# Patient Record
Sex: Male | Born: 1975 | Race: White | Hispanic: No | Marital: Married | Smoking: Former smoker
Health system: Southern US, Community
[De-identification: ages and names within clinical notes are randomized; demographics above are authoritative.]

## PROBLEM LIST (undated history)

## (undated) DIAGNOSIS — K209 Esophagitis, unspecified without bleeding: Secondary | ICD-10-CM

## (undated) DIAGNOSIS — F419 Anxiety disorder, unspecified: Secondary | ICD-10-CM

## (undated) DIAGNOSIS — I10 Essential (primary) hypertension: Secondary | ICD-10-CM

## (undated) DIAGNOSIS — K449 Diaphragmatic hernia without obstruction or gangrene: Secondary | ICD-10-CM

## (undated) DIAGNOSIS — E785 Hyperlipidemia, unspecified: Secondary | ICD-10-CM

## (undated) DIAGNOSIS — K429 Umbilical hernia without obstruction or gangrene: Secondary | ICD-10-CM

## (undated) DIAGNOSIS — R7303 Prediabetes: Secondary | ICD-10-CM

## (undated) DIAGNOSIS — K2270 Barrett's esophagus without dysplasia: Secondary | ICD-10-CM

## (undated) DIAGNOSIS — K5792 Diverticulitis of intestine, part unspecified, without perforation or abscess without bleeding: Secondary | ICD-10-CM

## (undated) DIAGNOSIS — K219 Gastro-esophageal reflux disease without esophagitis: Secondary | ICD-10-CM

## (undated) DIAGNOSIS — K259 Gastric ulcer, unspecified as acute or chronic, without hemorrhage or perforation: Secondary | ICD-10-CM

## (undated) DIAGNOSIS — K279 Peptic ulcer, site unspecified, unspecified as acute or chronic, without hemorrhage or perforation: Secondary | ICD-10-CM

## (undated) HISTORY — DX: Anxiety disorder, unspecified: F41.9

## (undated) HISTORY — DX: Essential (primary) hypertension: I10

## (undated) HISTORY — DX: Barrett's esophagus without dysplasia: K22.70

## (undated) HISTORY — DX: Prediabetes: R73.03

## (undated) HISTORY — DX: Diaphragmatic hernia without obstruction or gangrene: K44.9

## (undated) HISTORY — DX: Umbilical hernia without obstruction or gangrene: K42.9

## (undated) HISTORY — DX: Gastro-esophageal reflux disease without esophagitis: K21.9

## (undated) HISTORY — DX: Hyperlipidemia, unspecified: E78.5

## (undated) HISTORY — DX: Esophagitis, unspecified: K20.9

## (undated) HISTORY — PX: UPPER GASTROINTESTINAL ENDOSCOPY: SHX188

## (undated) HISTORY — DX: Esophagitis, unspecified without bleeding: K20.90

---

## 2008-05-17 ENCOUNTER — Emergency Department (HOSPITAL_COMMUNITY): Admission: EM | Admit: 2008-05-17 | Discharge: 2008-05-18 | Payer: Self-pay | Admitting: Emergency Medicine

## 2008-05-18 ENCOUNTER — Emergency Department (HOSPITAL_COMMUNITY): Admission: EM | Admit: 2008-05-18 | Discharge: 2008-05-19 | Payer: Self-pay | Admitting: Emergency Medicine

## 2010-06-11 LAB — COMPREHENSIVE METABOLIC PANEL
ALT: 30 U/L (ref 0–53)
AST: 31 U/L (ref 0–37)
Alkaline Phosphatase: 56 U/L (ref 39–117)
BUN: 14 mg/dL (ref 6–23)
CO2: 23 mEq/L (ref 19–32)
Calcium: 9.2 mg/dL (ref 8.4–10.5)
Creatinine, Ser: 1.27 mg/dL (ref 0.4–1.5)
GFR calc Af Amer: >60 mL/min (ref >60)
GFR calc non Af Amer: >60 mL/min (ref >60)
Glucose, Bld: 106 mg/dL — ABNORMAL HIGH (ref 70–99)
Glucose, Bld: 156 mg/dL — ABNORMAL HIGH (ref 70–99)
Potassium: 2.7 mEq/L — CL (ref 3.5–5.1)
Sodium: 138 mEq/L (ref 135–145)
Total Protein: 7.2 g/dL (ref 6.0–8.3)
Total Protein: 7.4 g/dL (ref 6.0–8.3)

## 2010-06-11 LAB — CBC
HCT: 47.5 % (ref 39.0–52.0)
HCT: 49.1 % (ref 39.0–52.0)
Hemoglobin: 15.7 g/dL (ref 13.0–17.0)
Hemoglobin: 16.5 g/dL (ref 13.0–17.0)
MCHC: 33 g/dL (ref 30.0–36.0)
MCHC: 33.6 g/dL (ref 30.0–36.0)
MCV: 83 fL (ref 78.0–100.0)
MCV: 84.4 fL (ref 78.0–100.0)
Platelets: 256 K/uL (ref 150–400)
RBC: 5.63 MIL/uL (ref 4.22–5.81)
RBC: 5.92 MIL/uL — ABNORMAL HIGH (ref 4.22–5.81)
RDW: 12.8 % (ref 11.5–15.5)
RDW: 13.2 % (ref 11.5–15.5)
WBC: 12 K/uL — ABNORMAL HIGH (ref 4.0–10.5)

## 2010-06-11 LAB — GASTRIC OCCULT BLOOD (1-CARD TO LAB)
Occult Blood, Gastric: POSITIVE — AB
pH, Gastric: 4

## 2010-06-11 LAB — COMPREHENSIVE METABOLIC PANEL WITH GFR
Albumin: 4.4 g/dL (ref 3.5–5.2)
BUN: 23 mg/dL (ref 6–23)
CO2: 30 meq/L (ref 19–32)
Calcium: 9.5 mg/dL (ref 8.4–10.5)
Chloride: 96 meq/L (ref 96–112)
Creatinine, Ser: 1.33 mg/dL (ref 0.4–1.5)
GFR calc non Af Amer: >60 mL/min (ref >60)
Total Bilirubin: 1.7 mg/dL — ABNORMAL HIGH (ref 0.3–1.2)

## 2010-06-11 LAB — URINALYSIS, ROUTINE W REFLEX MICROSCOPIC
Glucose, UA: NEGATIVE mg/dL
Ketones, ur: 40 mg/dL — AB
Nitrite: NEGATIVE
Protein, ur: NEGATIVE mg/dL
pH: 7.5 (ref 5.0–8.0)

## 2010-06-11 LAB — DIFFERENTIAL
Basophils Absolute: 0 K/uL (ref 0.0–0.1)
Basophils Relative: 0 % (ref 0–1)
Basophils Relative: 0 % (ref 0–1)
Eosinophils Absolute: 0 10*3/uL (ref 0.0–0.7)
Eosinophils Absolute: 0 10*3/uL (ref 0.0–0.7)
Eosinophils Relative: 0 % (ref 0–5)
Lymphocytes Relative: 8 % — ABNORMAL LOW (ref 12–46)
Lymphs Abs: 1 K/uL (ref 0.7–4.0)
Lymphs Abs: 1.2 10*3/uL (ref 0.7–4.0)
Monocytes Absolute: 0.8 10*3/uL (ref 0.1–1.0)
Monocytes Relative: 7 % (ref 3–12)
Monocytes Relative: 7 % (ref 3–12)
Neutro Abs: 10.1 K/uL — ABNORMAL HIGH (ref 1.7–7.7)
Neutro Abs: 8.8 10*3/uL — ABNORMAL HIGH (ref 1.7–7.7)
Neutrophils Relative %: 82 % — ABNORMAL HIGH (ref 43–77)
Neutrophils Relative %: 85 % — ABNORMAL HIGH (ref 43–77)

## 2010-06-11 LAB — LIPASE, BLOOD
Lipase: 15 U/L (ref 11–59)
Lipase: 15 U/L (ref 11–59)

## 2011-03-13 ENCOUNTER — Encounter (HOSPITAL_COMMUNITY): Payer: Self-pay | Admitting: *Deleted

## 2011-03-13 ENCOUNTER — Emergency Department (HOSPITAL_COMMUNITY)
Admission: EM | Admit: 2011-03-13 | Discharge: 2011-03-13 | Payer: BC Managed Care – PPO | Attending: Emergency Medicine | Admitting: Emergency Medicine

## 2011-03-13 ENCOUNTER — Emergency Department (HOSPITAL_COMMUNITY): Payer: BC Managed Care – PPO

## 2011-03-13 DIAGNOSIS — E860 Dehydration: Secondary | ICD-10-CM | POA: Insufficient documentation

## 2011-03-13 DIAGNOSIS — D7289 Other specified disorders of white blood cells: Secondary | ICD-10-CM | POA: Insufficient documentation

## 2011-03-13 DIAGNOSIS — R112 Nausea with vomiting, unspecified: Secondary | ICD-10-CM | POA: Insufficient documentation

## 2011-03-13 DIAGNOSIS — D729 Disorder of white blood cells, unspecified: Secondary | ICD-10-CM

## 2011-03-13 DIAGNOSIS — R1033 Periumbilical pain: Secondary | ICD-10-CM | POA: Insufficient documentation

## 2011-03-13 HISTORY — DX: Gastric ulcer, unspecified as acute or chronic, without hemorrhage or perforation: K25.9

## 2011-03-13 LAB — DIFFERENTIAL
Basophils Absolute: 0 10*3/uL (ref 0.0–0.1)
Lymphocytes Relative: 5 % — ABNORMAL LOW (ref 12–46)
Monocytes Relative: 5 % (ref 3–12)
Neutrophils Relative %: 90 % — ABNORMAL HIGH (ref 43–77)

## 2011-03-13 LAB — URINALYSIS, ROUTINE W REFLEX MICROSCOPIC
Leukocytes, UA: NEGATIVE
Protein, ur: 30 mg/dL — AB
Urobilinogen, UA: 0.2 mg/dL (ref 0.0–1.0)

## 2011-03-13 LAB — COMPREHENSIVE METABOLIC PANEL
ALT: 20 U/L (ref 0–53)
AST: 24 U/L (ref 0–37)
Albumin: 4.6 g/dL (ref 3.5–5.2)
CO2: 22 mEq/L (ref 19–32)
Calcium: 9.9 mg/dL (ref 8.4–10.5)
Creatinine, Ser: 1.01 mg/dL (ref 0.50–1.35)
GFR calc Af Amer: >90 mL/min (ref >90)
GFR calc non Af Amer: >90 mL/min (ref >90)
Sodium: 137 mEq/L (ref 135–145)
Total Bilirubin: 0.6 mg/dL (ref 0.3–1.2)

## 2011-03-13 LAB — URINE MICROSCOPIC-ADD ON

## 2011-03-13 LAB — CBC
Platelets: 306 10*3/uL (ref 150–400)
RDW: 13.9 % (ref 11.5–15.5)
WBC: 26.1 10*3/uL — ABNORMAL HIGH (ref 4.0–10.5)

## 2011-03-13 MED ORDER — IOHEXOL 300 MG/ML  SOLN
40.0000 mL | Freq: Once | INTRAMUSCULAR | Status: AC | PRN
  Administered 2011-03-13: 40 mL via ORAL

## 2011-03-13 MED ORDER — PROMETHAZINE HCL 25 MG/ML IJ SOLN
25.0000 mg | Freq: Once | INTRAMUSCULAR | Status: AC
  Administered 2011-03-13: 25 mg via INTRAVENOUS
  Filled 2011-03-13: qty 1

## 2011-03-13 MED ORDER — FENTANYL CITRATE 0.05 MG/ML IJ SOLN
INTRAMUSCULAR | Status: AC
  Administered 2011-03-13: 50 ug via INTRAVENOUS
  Filled 2011-03-13: qty 2

## 2011-03-13 MED ORDER — SODIUM CHLORIDE 0.9 % IV SOLN
Freq: Once | INTRAVENOUS | Status: AC
  Administered 2011-03-13: 10000 mL via INTRAVENOUS

## 2011-03-13 MED ORDER — HYDROMORPHONE HCL PF 1 MG/ML IJ SOLN
1.0000 mg | Freq: Once | INTRAMUSCULAR | Status: AC
  Administered 2011-03-13: 1 mg via INTRAVENOUS
  Filled 2011-03-13: qty 1

## 2011-03-13 MED ORDER — ONDANSETRON HCL 4 MG/2ML IJ SOLN
4.0000 mg | Freq: Once | INTRAMUSCULAR | Status: AC
  Administered 2011-03-13: 4 mg via INTRAVENOUS

## 2011-03-13 MED ORDER — FAMOTIDINE 20 MG PO TABS: 20.0000 mg | ORAL_TABLET | Freq: Two times a day (BID) | ORAL | Status: DC

## 2011-03-13 MED ORDER — GI COCKTAIL ~~LOC~~
30.0000 mL | Freq: Once | ORAL | Status: AC
  Administered 2011-03-13: 30 mL via ORAL
  Filled 2011-03-13: qty 30

## 2011-03-13 MED ORDER — SODIUM CHLORIDE 0.9 % IV BOLUS (SEPSIS)
1000.0000 mL | Freq: Once | INTRAVENOUS | Status: AC
  Administered 2011-03-13: 1000 mL via INTRAVENOUS

## 2011-03-13 MED ORDER — ONDANSETRON HCL 4 MG/2ML IJ SOLN
INTRAMUSCULAR | Status: AC
  Administered 2011-03-13: 4 mg via INTRAVENOUS
  Filled 2011-03-13: qty 2

## 2011-03-13 MED ORDER — IOHEXOL 300 MG/ML  SOLN
100.0000 mL | Freq: Once | INTRAMUSCULAR | Status: AC | PRN
  Administered 2011-03-13: 100 mL via INTRAVENOUS

## 2011-03-13 MED ORDER — PROMETHAZINE HCL 25 MG PO TABS: 25.0000 mg | ORAL_TABLET | Freq: Four times a day (QID) | ORAL | Status: DC | PRN

## 2011-03-13 MED ORDER — OXYCODONE-ACETAMINOPHEN 5-325 MG PO TABS: 1.0000 | ORAL_TABLET | ORAL | Status: AC | PRN

## 2011-03-13 MED ORDER — FENTANYL CITRATE 0.05 MG/ML IJ SOLN
50.0000 ug | Freq: Once | INTRAMUSCULAR | Status: AC
  Administered 2011-03-13: 50 ug via INTRAVENOUS

## 2011-03-13 NOTE — ED Provider Notes (Signed)
History     CSN: [id number]  Arrival date & time 03/13/11  4098   First MD Initiated Contact with Patient 03/13/11 0324      Chief Complaint  Patient presents with  . Abdominal Pain  . Emesis    (Consider location/radiation/quality/duration/timing/severity/associated sxs/prior treatment) HPI Comments: 36 year old male with a history of peptic ulcer disease, presents with approximately 12 hours of mid abdominal cramping pain associated with multiple episodes of nausea and vomiting. He describes coffee-ground type vomiting earlier in the day. He denies any change in his bowel habits including rectal bleeding, dark or melanotic stools. Symptoms are persistent, not relief with medications at home, severe at times, associated with a crampy abdominal pain in the mid to lower abdomen which comes on intermittently every 5-10 seconds. He denies prior abdominal surgery.  Patient is a 36 y.o. male presenting with abdominal pain and vomiting. The history is provided by the patient and the spouse.  Abdominal Pain The primary symptoms of the illness include abdominal pain and vomiting.  Emesis  Associated symptoms include abdominal pain.    Past Medical History  Diagnosis Date  . Stomach ulcer     History reviewed. No pertinent past surgical history.  Family History  Problem Relation Age of Onset  . Adopted: Yes    History  Substance Use Topics  . Smoking status: Current Some Day Smoker  . Smokeless tobacco: Not on file  . Alcohol Use: Yes     occaisional      Review of Systems  Gastrointestinal: Positive for vomiting and abdominal pain.  All other systems reviewed and are negative.    Allergies  Penicillins  Home Medications   Current Outpatient Rx  Name Route Sig Dispense Refill  . OMEPRAZOLE 20 MG PO CPDR Oral Take 20 mg by mouth daily.    Marland Kitchen FAMOTIDINE 20 MG PO TABS Oral Take 1 tablet (20 mg total) by mouth 2 (two) times daily. 30 tablet 0  . OXYCODONE-ACETAMINOPHEN  5-325 MG PO TABS Oral Take 1 tablet by mouth every 4 (four) hours as needed for pain. May take 2 tablets PO q 6 hours for severe pain - Do not take with Tylenol as this tablet already contains tylenol 15 tablet 0  . PROMETHAZINE HCL 25 MG PO TABS Oral Take 1 tablet (25 mg total) by mouth every 6 (six) hours as needed for nausea. 12 tablet 0    BP 120/71  Pulse 103  Temp(Src) 97.7 F (36.5 C) (Oral)  Resp 30  SpO2 99%  Physical Exam  Nursing note and vitals reviewed. Constitutional: He appears well-developed and well-nourished.       Uncomfortable appearing  HENT:  Head: Normocephalic and atraumatic.  Mouth/Throat: No oropharyngeal exudate.       Slightly dry mucous membranes  Eyes: Conjunctivae and EOM are normal. Pupils are equal, round, and reactive to light. Right eye exhibits no discharge. Left eye exhibits no discharge. No scleral icterus.  Neck: Normal range of motion. Neck supple. No JVD present. No thyromegaly present.  Cardiovascular: Normal rate, regular rhythm, normal heart sounds and intact distal pulses.  Exam reveals no gallop and no friction rub.   No murmur heard. Pulmonary/Chest: Effort normal and breath sounds normal. No respiratory distress. He has no wheezes. He has no rales.  Abdominal: Soft. Bowel sounds are normal. He exhibits no distension and no mass. There is no tenderness.       Increased bowel sounds, no tenderness on my exam including epigastrium,  right lower quadrant, left lower quadrant  Musculoskeletal: Normal range of motion. He exhibits no edema and no tenderness.  Lymphadenopathy:    He has no cervical adenopathy.  Neurological: He is alert. Coordination normal.  Skin: Skin is warm and dry. No rash noted. No erythema.  Psychiatric: He has a normal mood and affect. His behavior is normal.    ED Course  Procedures (including critical care time)  Labs Reviewed  COMPREHENSIVE METABOLIC PANEL - Abnormal; Notable for the following:    Glucose, Bld  161 (*)    All other components within normal limits  CBC - Abnormal; Notable for the following:    WBC 26.1 (*)    All other components within normal limits  DIFFERENTIAL - Abnormal; Notable for the following:    Neutrophils Relative 90 (*)    Lymphocytes Relative 5 (*)    Neutro Abs 23.5 (*)    Monocytes Absolute 1.3 (*)    All other components within normal limits  URINALYSIS, ROUTINE W REFLEX MICROSCOPIC - Abnormal; Notable for the following:    pH 8.5 (*)    Glucose, UA 250 (*)    Ketones, ur 15 (*)    Protein, ur 30 (*)    All other components within normal limits  LIPASE, BLOOD  URINE MICROSCOPIC-ADD ON  CBC   Ct Abdomen Pelvis W Contrast  03/13/2011  *RADIOLOGY REPORT*  Clinical Data: Nausea, vomiting and abdominal pain; leukocytosis.  CT ABDOMEN AND PELVIS WITH CONTRAST  Technique:  Multidetector CT imaging of the abdomen and pelvis was performed following the standard protocol during bolus administration of intravenous contrast.  Contrast: OMNIPAQUE IOHEXOL 300 MG/ML IV SOLN  Comparison: None.  Findings: Mild bibasilar atelectasis is noted.  The liver and spleen are unremarkable in appearance.  The gallbladder is within normal limits.  The pancreas and adrenal glands are unremarkable.  The kidneys are unremarkable in appearance.  There is no evidence of hydronephrosis.  No renal or ureteral stones are seen.  No perinephric stranding is appreciated.  No free fluid is identified.  The small bowel is unremarkable in appearance.  The stomach is within normal limits.  No acute vascular abnormalities are seen.  The appendix is normal in caliber and contains air, without evidence for appendicitis.  Scattered diverticulosis is noted along the ascending colon, without evidence for diverticulitis.  Two small anterior abdominal wall hernias are noted superior to the umbilicus, containing only fat, with minimal soft tissue stranding.  The bladder is relatively decompressed; bladder wall  thickening may reflect cystitis.  The prostate remains normal in size.  No inguinal lymphadenopathy is seen.  No acute osseous abnormalities are identified.  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  IMPRESSION:  1.  Diffuse bladder wall thickening may reflect cystitis. 2.  Two small anterior abdominal wall hernias noted superior to the umbilicus, containing only fat, with minimal soft tissue inflammation. 3.  Scattered diverticulosis along the ascending colon, without evidence for diverticulitis. 4.  Mild bibasilar atelectasis noted. 5.  Chronic bilateral pars defects at L5, with minimal grade 1 anterolisthesis of L5 on S1.  Original Report Authenticated By: Tonia Ghent, M.D.     1. Abdominal pain   2. Nausea and vomiting   3. Dehydration   4. Neutrophilic leukocytosis       MDM  Patient has nausea and vomiting primarily. He states that when he vomits he does have some mid abdominal  pain. There is absolutely no tenderness in his abdomen on my exam. He is very soft, no guarding and not peritoneal. Vital signs show slight tachypnea related to pain, there are no pulmonary findings abnormal on my exam. Otherwise vital signs look normal. Would evaluate for dehydration, pancreatitis, cholecystitis, peptic ulcer disease. Doubt perforation given soft abdomen. IV fluids, antiemetics, pain medicines as needed.    5:00 AM, patient reevaluated and has almost complete resolution of pain after intravenous hydromorphone. Laboratory evaluation shows leukocytosis of 26,000, leftward shift with 90% neutrophils.  Comparing to past laboratory results, patient had essentially normal white blood cell count 2 years ago when last checked. Proceed with CT scan to rule out significant findings.   6:35 AM I have discussed with patient his results including dehydration, leukocytosis which is been unexplained at this point, cooperative metabolic panel, lipase. I have  also explained his CT scan findings. I have encouraged him to followup in one week for a repeat blood count test. I have reexamined his abdomen several times during his stay and it continues to be nontender. After his initial dose of medications he had complete resolution of pain and nausea and vomiting. He has been given 2 L of IV fluids for his dehydration and is agreeable to discharge with followup.  Discharge prescriptions:  #1 Pepcid #2 Percocet #3 Phenergan   Vida Roller, MD 03/13/11 [phone]

## 2011-03-13 NOTE — ED Notes (Signed)
MD at bedside. 

## 2011-03-13 NOTE — ED Notes (Addendum)
Sudden onset of abd pain, and vomiting, "feels dehydrated". (denies diarrea), "unsure of fever". mentions "stomach ulcer", last emesis PTA. Tried emitrol, dramamine & omeprazole.... Was unable to keep anything down. Pt alert, interactive, shivering. Mentions coffee ground and bloody emesis. H/o similar. Denies ETOH tonight. Violently shivering, rectal temp normal.

## 2011-04-11 ENCOUNTER — Encounter (HOSPITAL_COMMUNITY): Payer: Self-pay | Admitting: *Deleted

## 2011-04-11 ENCOUNTER — Emergency Department (HOSPITAL_COMMUNITY)
Admission: EM | Admit: 2011-04-11 | Discharge: 2011-04-11 | Disposition: A | Discharge status: Home / Self Care | Payer: BC Managed Care – PPO | Attending: Emergency Medicine | Admitting: Emergency Medicine

## 2011-04-11 DIAGNOSIS — Z79899 Other long term (current) drug therapy: Secondary | ICD-10-CM | POA: Insufficient documentation

## 2011-04-11 DIAGNOSIS — R61 Generalized hyperhidrosis: Secondary | ICD-10-CM | POA: Insufficient documentation

## 2011-04-11 DIAGNOSIS — R1031 Right lower quadrant pain: Secondary | ICD-10-CM | POA: Insufficient documentation

## 2011-04-11 DIAGNOSIS — R112 Nausea with vomiting, unspecified: Secondary | ICD-10-CM | POA: Insufficient documentation

## 2011-04-11 DIAGNOSIS — F17200 Nicotine dependence, unspecified, uncomplicated: Secondary | ICD-10-CM | POA: Insufficient documentation

## 2011-04-11 HISTORY — DX: Diverticulitis of intestine, part unspecified, without perforation or abscess without bleeding: K57.92

## 2011-04-11 LAB — URINALYSIS, ROUTINE W REFLEX MICROSCOPIC
Bilirubin Urine: NEGATIVE
Ketones, ur: 15 mg/dL — AB
Nitrite: NEGATIVE
Protein, ur: NEGATIVE mg/dL
pH: 8 (ref 5.0–8.0)

## 2011-04-11 LAB — COMPREHENSIVE METABOLIC PANEL
Alkaline Phosphatase: 62 U/L (ref 39–117)
BUN: 16 mg/dL (ref 6–23)
Creatinine, Ser: 1.14 mg/dL (ref 0.50–1.35)
GFR calc Af Amer: >90 mL/min (ref >90)
Glucose, Bld: 195 mg/dL — ABNORMAL HIGH (ref 70–99)
Potassium: 4 mEq/L (ref 3.5–5.1)
Total Bilirubin: 0.3 mg/dL (ref 0.3–1.2)
Total Protein: 8.1 g/dL (ref 6.0–8.3)

## 2011-04-11 LAB — CBC
MCH: 27.8 pg (ref 26.0–34.0)
MCHC: 34.8 g/dL (ref 30.0–36.0)
MCV: 80 fL (ref 78.0–100.0)
Platelets: 263 10*3/uL (ref 150–400)
RBC: 5.64 MIL/uL (ref 4.22–5.81)
RDW: 14 % (ref 11.5–15.5)

## 2011-04-11 MED ORDER — SODIUM CHLORIDE 0.9 % IV BOLUS (SEPSIS)
1000.0000 mL | Freq: Once | INTRAVENOUS | Status: AC
  Administered 2011-04-11: 1000 mL via INTRAVENOUS

## 2011-04-11 MED ORDER — DICYCLOMINE HCL 20 MG PO TABS: 20.0000 mg | ORAL_TABLET | Freq: Two times a day (BID) | ORAL | Status: DC

## 2011-04-11 MED ORDER — ONDANSETRON HCL 4 MG/2ML IJ SOLN
4.0000 mg | Freq: Once | INTRAMUSCULAR | Status: AC
  Administered 2011-04-11: 4 mg via INTRAVENOUS
  Filled 2011-04-11: qty 2

## 2011-04-11 MED ORDER — HYDROMORPHONE HCL PF 2 MG/ML IJ SOLN
2.0000 mg | Freq: Once | INTRAMUSCULAR | Status: AC
  Administered 2011-04-11: 2 mg via INTRAVENOUS
  Filled 2011-04-11: qty 1

## 2011-04-11 MED ORDER — PROMETHAZINE HCL 25 MG RE SUPP: 25.0000 mg | Freq: Four times a day (QID) | RECTAL | Status: DC | PRN

## 2011-04-11 MED ORDER — PROMETHAZINE HCL 25 MG/ML IJ SOLN
25.0000 mg | INTRAMUSCULAR | Status: AC
  Administered 2011-04-11: 25 mg via INTRAVENOUS
  Filled 2011-04-11: qty 1

## 2011-04-11 NOTE — ED Provider Notes (Signed)
Medical screening examination/treatment/procedure(s) were performed by non-physician practitioner and as supervising physician I was immediately available for consultation/collaboration.  Saulo Anthis R. Bellany Elbaum, MD 04/11/11 1513 

## 2011-04-11 NOTE — ED Provider Notes (Signed)
History     CSN: [id number]  Arrival date & time 04/11/11  1478   First MD Initiated Contact with Patient 04/11/11 [phone]      Chief Complaint  Patient presents with  . Emesis    (Consider location/radiation/quality/duration/timing/severity/associated sxs/prior treatment) HPI  Pt presents to the ED with complaitns of severe abdominal pain with vomiting that started last night at 11pm. He has had this problem before and was seen in the [facility] ED for the same on 03/13/2011. He had a scan done at that time which showed no acute findings to explain patients pain. Pt has seen GI in [place] and has had an endoscopy done with no abnormal findings a few years ago. Pt states that they have started him on anxiety medications because they are questioning if his pain is related. Patient not vomiting in ED but is dry heaving. He states that his vomit was watery and yellow. He describes the pain as right lower quadrant and does not radiate to any other location. Quality of the pain is cramping/twisting/sharp and 10/10.  Past Medical History  Diagnosis Date  . Stomach ulcer   . Diverticulitis     History reviewed. No pertinent past surgical history.  Family History  Problem Relation Age of Onset  . Adopted: Yes    History  Substance Use Topics  . Smoking status: Current Some Day Smoker  . Smokeless tobacco: Not on file  . Alcohol Use: Yes     occaisional      Review of Systems  All other systems reviewed and are negative.    Allergies  Penicillins  Home Medications   Current Outpatient Rx  Name Route Sig Dispense Refill  . FAMOTIDINE 20 MG PO TABS Oral Take 20 mg by mouth daily as needed. For acid reflux    . OMEPRAZOLE 20 MG PO CPDR Oral Take 20 mg by mouth daily.    . SUCRALFATE 1 G PO TABS Oral Take 1 g by mouth 3 (three) times daily.    Marland Kitchen DICYCLOMINE HCL 20 MG PO TABS Oral Take 1 tablet (20 mg total) by mouth 2 (two) times daily. 20 tablet 0  . PROMETHAZINE HCL 25 MG RE  SUPP Rectal Place 1 suppository (25 mg total) rectally every 6 (six) hours as needed for nausea. 12 each 0    BP 107/90  Pulse 86  Temp(Src) 97.7 F (36.5 C) (Oral)  Resp 18  SpO2 99%  Physical Exam  Nursing note and vitals reviewed. Constitutional: He is oriented to person, place, and time. He appears well-developed and well-nourished. Distressed: pt is very uncomfortable with pain.  HENT:  Head: Normocephalic and atraumatic.  Eyes: Pupils are equal, round, and reactive to light.  Neck: Normal range of motion.  Cardiovascular: Normal rate and regular rhythm.   Pulmonary/Chest: Effort normal and breath sounds normal.  Abdominal: Soft. He exhibits no distension. There is Tenderness: pt denies tenderness to palpation of all four quadrants. He states pain is inside of LLQ.Marland Kitchen There is no rebound and no guarding.  Musculoskeletal: Normal range of motion.  Neurological: He is oriented to person, place, and time.  Skin: Skin is warm. He is diaphoretic.    ED Course  Procedures (including critical care time)  Labs Reviewed  CBC - Abnormal; Notable for the following:    WBC 19.3 (*)    All other components within normal limits  URINALYSIS, ROUTINE W REFLEX MICROSCOPIC - Abnormal; Notable for the following:    Glucose, UA  500 (*)    Ketones, ur 15 (*)    All other components within normal limits  COMPREHENSIVE METABOLIC PANEL - Abnormal; Notable for the following:    Glucose, Bld 195 (*)    GFR calc non Af Amer 81 (*)    All other components within normal limits  LIPASE, BLOOD  BASIC METABOLIC PANEL   No results found.   1. Abdominal pain   2. Nausea and vomiting       MDM    Patient Complaints #1 nausea #2 vomiting #3 abdominal pain  Time of re-evaluation 7:43 AM- patient is still very nauseated but his abdominal pain is resolving.  8:57 AM- patient feeling much better now and is agreeable to go home. Pt was challeneged with oral fluids and was able to tolerate  without vomiting.  Plan  Pt has had multiple scans and his pain is now well controlled, we are considering a anxiety component to the abdominal pain. Dr. Rubin Payor and I believe he is safe to DC at this time.  Referrals Pt requests referral to a new GI doctor.  Prescriptions #1 Bentyl #2 Phenergan Supoository   Continue these medications as noted  [place], Kilmer  Home Medication Instructions [id number]   Printed on:04/11/11 0757  Medication Information                    famotidine (PEPCID) 20 MG tablet Take 20 mg by mouth daily as needed. For acid reflux           omeprazole (PRILOSEC) 20 MG capsule Take 20 mg by mouth daily.           sucralfate (CARAFATE) 1 G tablet Take 1 g by mouth 3 (three) times daily.             Pt has been given return to ER precautions. Pt is agreeable with plan and will return to the ED as needed for any changing or worsening of symptoms.         Dorthula Matas, PA 04/11/11 [phone]

## 2011-04-11 NOTE — ED Notes (Signed)
To ed for eval of vomiting since 11pm last pm. Pt states he was in ED last month for same with same symptoms. Dx with diverticulitis

## 2011-06-11 ENCOUNTER — Encounter: Payer: Self-pay | Admitting: Internal Medicine

## 2011-06-11 ENCOUNTER — Other Ambulatory Visit (INDEPENDENT_AMBULATORY_CARE_PROVIDER_SITE_OTHER): Payer: BC Managed Care – PPO

## 2011-06-11 ENCOUNTER — Ambulatory Visit (INDEPENDENT_AMBULATORY_CARE_PROVIDER_SITE_OTHER): Payer: BC Managed Care – PPO | Admitting: Internal Medicine

## 2011-06-11 VITALS — BP 130/82 | HR 78 | Temp 98.7°F | Resp 16 | Wt 194.0 lb

## 2011-06-11 DIAGNOSIS — R7309 Other abnormal glucose: Secondary | ICD-10-CM

## 2011-06-11 DIAGNOSIS — R10817 Generalized abdominal tenderness: Secondary | ICD-10-CM | POA: Insufficient documentation

## 2011-06-11 DIAGNOSIS — K279 Peptic ulcer, site unspecified, unspecified as acute or chronic, without hemorrhage or perforation: Secondary | ICD-10-CM

## 2011-06-11 LAB — CBC WITH DIFFERENTIAL/PLATELET
Eosinophils Relative: 1.7 % (ref 0.0–5.0)
HCT: 47.5 % (ref 39.0–52.0)
Monocytes Relative: 6.7 % (ref 3.0–12.0)
Neutrophils Relative %: 72.5 % (ref 43.0–77.0)
Platelets: 252 10*3/uL (ref 150.0–400.0)
WBC: 7.6 10*3/uL (ref 4.5–10.5)

## 2011-06-11 LAB — LDL CHOLESTEROL, DIRECT: Direct LDL: 158.3 mg/dL

## 2011-06-11 LAB — COMPREHENSIVE METABOLIC PANEL
Albumin: 4.6 g/dL (ref 3.5–5.2)
CO2: 30 mEq/L (ref 19–32)
GFR: 70.59 mL/min (ref >60.00)
Glucose, Bld: 100 mg/dL — ABNORMAL HIGH (ref 70–99)
Potassium: 4.3 mEq/L (ref 3.5–5.1)
Sodium: 139 mEq/L (ref 135–145)
Total Protein: 7.8 g/dL (ref 6.0–8.3)

## 2011-06-11 LAB — URINALYSIS, ROUTINE W REFLEX MICROSCOPIC
Bilirubin Urine: NEGATIVE
Leukocytes, UA: NEGATIVE
Nitrite: NEGATIVE
Specific Gravity, Urine: 1.02 (ref 1.000–1.030)
pH: 7.5 (ref 5.0–8.0)

## 2011-06-11 LAB — LIPID PANEL: HDL: 49.5 mg/dL (ref >39.00)

## 2011-06-11 LAB — AMYLASE: Amylase: 84 U/L (ref 27–131)

## 2011-06-11 LAB — TSH: TSH: 3.22 u[IU]/mL (ref 0.35–5.50)

## 2011-06-11 LAB — HEMOGLOBIN A1C: Hgb A1c MFr Bld: 5.7 % (ref 4.6–6.5)

## 2011-06-11 MED ORDER — DEXLANSOPRAZOLE 60 MG PO CPDR: 60.0000 mg | DELAYED_RELEASE_CAPSULE | Freq: Every day | ORAL | Status: DC

## 2011-06-11 NOTE — Assessment & Plan Note (Signed)
I will change his PPI to dexilant and have asked him to see GI about a repeat EGD - in 2010 at [facility] he had a significant amount of inflammatory gastritis so I am concerned that he may have scar tissue or a recurrent ulcer

## 2011-06-11 NOTE — Assessment & Plan Note (Signed)
I will check his labs to look for secondary causes, I do not see anything today that is acute

## 2011-06-11 NOTE — Patient Instructions (Signed)

## 2011-06-11 NOTE — Assessment & Plan Note (Signed)
I will check his a1c today to se if he has DM II

## 2011-06-11 NOTE — Progress Notes (Signed)
Subjective:    Patient ID: Warren Thomas, male    DOB: Apr 24, 1975, 36 y.o.   MRN: [id number]  Abdominal Pain This is a recurrent problem. The current episode started more than 1 year ago. The onset quality is gradual. The problem occurs intermittently. The most recent episode lasted 2 months. The problem has been unchanged. The pain is located in the LLQ. The pain is at a severity of 1/10. The pain is mild. The quality of the pain is sharp. The abdominal pain does not radiate. Associated symptoms include constipation, nausea and vomiting. Pertinent negatives include no anorexia, arthralgias, belching, diarrhea, dysuria, fever, flatus, frequency, headaches, hematochezia, hematuria, melena, myalgias or weight loss. The pain is aggravated by nothing. He has tried H2 blockers and proton pump inhibitors for the symptoms. The treatment provided mild relief. Prior diagnostic workup includes upper endoscopy and CT scan (in 2010 at [facility]). His past medical history is significant for PUD.      Review of Systems  Constitutional: Negative for fever, chills, weight loss, diaphoresis, activity change, appetite change, fatigue and unexpected weight change.  HENT: Negative.   Eyes: Negative.   Respiratory: Negative for apnea, cough, choking, chest tightness, shortness of breath, wheezing and stridor.   Cardiovascular: Negative for chest pain, palpitations and leg swelling.  Gastrointestinal: Positive for nausea, vomiting, abdominal pain and constipation. Negative for diarrhea, blood in stool, melena, hematochezia, abdominal distention, anal bleeding, rectal pain, anorexia and flatus.  Genitourinary: Negative for dysuria, urgency, frequency, hematuria, flank pain, decreased urine volume, discharge, penile swelling, scrotal swelling, enuresis, difficulty urinating, genital sores, penile pain and testicular pain.  Musculoskeletal: Negative for myalgias, back pain, joint swelling, arthralgias and gait problem.   Skin: Negative for color change, pallor, rash and wound.  Neurological: Negative.  Negative for headaches.  Hematological: Negative for adenopathy. Does not bruise/bleed easily.  Psychiatric/Behavioral: Negative.        Objective:   Physical Exam  Vitals reviewed. Constitutional: He is oriented to person, place, and time. He appears well-developed and well-nourished. No distress.  HENT:  Head: Normocephalic and atraumatic.  Mouth/Throat: Oropharynx is clear and moist. No oropharyngeal exudate.  Eyes: Conjunctivae are normal. Right eye exhibits no discharge. Left eye exhibits no discharge. No scleral icterus.  Neck: Normal range of motion. Neck supple. No JVD present. No tracheal deviation present. No thyromegaly present.  Cardiovascular: Normal rate, regular rhythm, normal heart sounds and intact distal pulses.  Exam reveals no gallop and no friction rub.   No murmur heard. Pulmonary/Chest: Effort normal and breath sounds normal. No stridor. No respiratory distress. He has no wheezes. He has no rales. He exhibits no tenderness.  Abdominal: Soft. Bowel sounds are normal. He exhibits no distension and no mass. There is no tenderness. There is no rebound and no guarding. Hernia confirmed negative in the right inguinal area and confirmed negative in the left inguinal area.  Genitourinary: Rectum normal, prostate normal, testes normal and penis normal. Rectal exam shows no external hemorrhoid, no internal hemorrhoid, no fissure, no mass, no tenderness and anal tone normal. Guaiac negative stool. Prostate is not enlarged and not tender. Right testis shows no mass, no swelling and no tenderness. Right testis is descended. Left testis shows no mass, no swelling and no tenderness. Left testis is descended. Circumcised. No penile tenderness. No discharge found.  Musculoskeletal: Normal range of motion. He exhibits no edema and no tenderness.  Lymphadenopathy:    He has no cervical adenopathy.  Right: No inguinal adenopathy present.       Left: No inguinal adenopathy present.  Neurological: He is oriented to person, place, and time.  Skin: Skin is warm and dry. No rash noted. He is not diaphoretic. No erythema. No pallor.  Psychiatric: He has a normal mood and affect. His behavior is normal. Judgment and thought content normal.     Lab Results  Component Value Date   WBC 19.3* 04/11/2011   HGB 15.7 04/11/2011   HCT 45.1 04/11/2011   PLT 263 04/11/2011   GLUCOSE 195* 04/11/2011   ALT 21 04/11/2011   AST 24 04/11/2011   NA 140 04/11/2011   K 4.0 04/11/2011   CL 100 04/11/2011   CREATININE 1.14 04/11/2011   BUN 16 04/11/2011   CO2 25 04/11/2011      Assessment & Plan:

## 2011-06-16 ENCOUNTER — Encounter: Payer: Self-pay | Admitting: Internal Medicine

## 2011-06-17 ENCOUNTER — Encounter: Payer: Self-pay | Admitting: Internal Medicine

## 2011-06-17 ENCOUNTER — Ambulatory Visit (INDEPENDENT_AMBULATORY_CARE_PROVIDER_SITE_OTHER): Payer: BC Managed Care – PPO | Admitting: Internal Medicine

## 2011-06-17 VITALS — BP 120/88 | HR 98 | Ht 69.0 in | Wt 191.4 lb

## 2011-06-17 DIAGNOSIS — K219 Gastro-esophageal reflux disease without esophagitis: Secondary | ICD-10-CM

## 2011-06-17 DIAGNOSIS — R110 Nausea: Secondary | ICD-10-CM

## 2011-06-17 DIAGNOSIS — K279 Peptic ulcer, site unspecified, unspecified as acute or chronic, without hemorrhage or perforation: Secondary | ICD-10-CM

## 2011-06-17 DIAGNOSIS — R1013 Epigastric pain: Secondary | ICD-10-CM

## 2011-06-17 MED ORDER — ONDANSETRON 4 MG PO TBDP: 4.0000 mg | ORAL_TABLET | Freq: Three times a day (TID) | ORAL | Status: AC | PRN

## 2011-06-17 MED ORDER — DEXLANSOPRAZOLE 60 MG PO CPDR: 60.0000 mg | DELAYED_RELEASE_CAPSULE | Freq: Every day | ORAL | Status: DC

## 2011-06-17 NOTE — Patient Instructions (Signed)
We have sent the following medications to your pharmacy for you to pick up at your convenience: Zofran ODT; take as directed    You have been scheduled for an endoscopy with propofol. Please follow written instructions given to you at your visit today.

## 2011-06-17 NOTE — Progress Notes (Signed)
Subjective:    Patient ID: Warren Thomas, male    DOB: 05-13-1975, 36 y.o.   MRN: [id number]  HPI Warren Thomas is a 36 yo male with PMH of intermittent episodes of epigastric pain, nausea and vomiting who seen in consultation at the request of Dr. Yetta Barre for evaluation of the same. He is alone today. The patient reports ongoing, but intermittent issues with abdominal pain, nausea and vomiting since 2001. He reports never seeking care from a doctor until about 2010. At that time he was having epigastric pain with nausea and vomiting, and underwent an upper endoscopy on 05/29/2008 in [place], [place]. He brings a copy of this report today which revealed LA grade C. esophagitis and moderate gastritis. At that time he was given acid suppression therapy, but he is no longer on such medicine, until recently when he was started on Dexilant by his primary care doctor.  He reports acute onset of nausea, vomiting, and epigastric abdominal pain which occurs every 3-6 months. He reports these attacks are very severe and incapacitating. He reports he can only get relief by seeking care in the emergency department for such attacks. His last attack was in February 2013. He reports feeling well today with no GI symptoms or complaints. He reports the attacks start with epigastric pain followed by intense nausea and vomiting. He reports associated dehydration and inability to take down fluids or food. These attacks last 24-48 hours. They're associated with subjective fever and chills. He also reports flushing. He reports occasionally hallucinations during these attacks. He reports in the past he's had electrolyte disturbances and leukocytosis during these events. He also reports severe left lower quadrant abdominal cramping, which is associated with the previously described attacks. The left lower quadrant pain usually occurs within 24 hours at the start of his upper abdominal pain. It is often this pain along with  the inability to keep down fluids to take him to the hospital. He reports that the only thing he is down that makes his left lower quadrant pain better is standing in a hot shower and drinking ice cold water and allowing it to run out of his mouth.  He does report a history of heartburn, but this improved with omeprazole therapy. He was recently changed to Dexilant and continues to have no trouble with heartburn, dysphagia or odynophagia. He has no abdominal pain today. Appetite is good. No nausea or vomiting. No fevers or chills recently.  He denies joint pains, rash. No urinary symptoms. No mouth ulcers.  Review of Systems As per history of present illness, otherwise notable for anxiety and issues with sleep disturbance  Patient Active Problem List  Diagnoses  . Abdominal tenderness, generalized  . PUD (peptic ulcer disease)  . Other abnormal glucose   Current Outpatient Prescriptions  Medication Sig Dispense Refill  . dexlansoprazole (DEXILANT) 60 MG capsule Take 1 capsule (60 mg total) by mouth daily.  90 capsule  3  . dicyclomine (BENTYL) 20 MG tablet Take 1 tablet (20 mg total) by mouth 2 (two) times daily.  20 tablet  0  . promethazine (PHENERGAN) 12.5 MG suppository Place 12.5 mg rectally.      . sucralfate (CARAFATE) 1 G tablet Take 1 g by mouth 3 (three) times daily.      Marland Kitchen DISCONTD: dexlansoprazole (DEXILANT) 60 MG capsule Take 1 capsule (60 mg total) by mouth daily.  90 capsule  3  . DISCONTD: dexlansoprazole (DEXILANT) 60 MG capsule Take 1 capsule (60 mg total)  by mouth daily.  90 capsule  3  . ondansetron (ZOFRAN-ODT) 4 MG disintegrating tablet Take 1 tablet (4 mg total) by mouth every 8 (eight) hours as needed for nausea.  20 tablet  0  . promethazine (PHENERGAN) 25 MG suppository Place 1 suppository (25 mg total) rectally every 6 (six) hours as needed for nausea.  12 each  0  . promethazine (PHENERGAN) 25 MG tablet Take 1 tablet (25 mg total) by mouth every 6 (six) hours as  needed for nausea.  12 tablet  0   Allergies  Allergen Reactions  . Penicillins Swelling   Family History  Problem Relation Age of Onset  . Adopted: Yes   History  Substance Use Topics  . Smoking status: Former Smoker    Quit date: 06/17/2010  . Smokeless tobacco: Not on file  . Alcohol Use: 1.2 oz/week    2 Cans of beer per week     occasional      Objective:   Physical Exam BP 120/88  Pulse 98  Ht 5\' 9"  (1.753 m)  Wt 191 lb 6.4 oz (86.818 kg)  BMI 28.26 kg/m2 Constitutional: Well-developed and well-nourished. No distress. HEENT: Normocephalic and atraumatic. Oropharynx is clear and moist. No oropharyngeal exudate. Conjunctivae are normal. Pupils are equal round and reactive to light. No scleral icterus. Neck: Neck supple. Trachea midline. Cardiovascular: Normal rate, regular rhythm and intact distal pulses. No M/R/G Pulmonary/chest: Effort normal and breath sounds normal. No wheezing, rales or rhonchi. Abdominal: Soft, nontender, nondistended. Bowel sounds active throughout. There are no masses palpable. No hepatosplenomegaly. Extremities: no clubbing, cyanosis, or edema Lymphadenopathy: No cervical adenopathy noted. Neurological: Alert and oriented to person place and time. Skin: Skin is warm and dry. No rashes noted. Psychiatric: Normal mood and affect. Behavior is normal.  CBC    Component Value Date/Time   WBC 7.6 06/11/2011 1126   RBC 5.71 06/11/2011 1126   HGB 15.6 06/11/2011 1126   HCT 47.5 06/11/2011 1126   PLT 252.0 06/11/2011 1126   MCV 83.3 06/11/2011 1126   MCH 27.8 04/11/2011 0701   MCHC 32.7 06/11/2011 1126   RDW 13.6 06/11/2011 1126   LYMPHSABS 1.4 06/11/2011 1126   MONOABS 0.5 06/11/2011 1126   EOSABS 0.1 06/11/2011 1126   BASOSABS 0.0 06/11/2011 1126    CMP     Component Value Date/Time   NA 139 06/11/2011 1126   K 4.3 06/11/2011 1126   CL 102 06/11/2011 1126   CO2 30 06/11/2011 1126   GLUCOSE 100* 06/11/2011 1126   BUN 13 06/11/2011 1126   CREATININE  1.2 06/11/2011 1126   CALCIUM 9.4 06/11/2011 1126   PROT 7.8 06/11/2011 1126   ALBUMIN 4.6 06/11/2011 1126   AST 24 06/11/2011 1126   ALT 22 06/11/2011 1126   ALKPHOS 55 06/11/2011 1126   BILITOT 0.3 06/11/2011 1126   GFRNONAA 81* 04/11/2011 0701   GFRAA >90 04/11/2011 0701   Lipase     Component Value Date/Time   LIPASE 17.0 06/11/2011 1126   Imaging, 03/03/2011 CT ABDOMEN AND PELVIS WITH CONTRAST   Technique:  Multidetector CT imaging of the abdomen and pelvis was performed following the standard protocol during bolus administration of intravenous contrast.   Contrast: OMNIPAQUE IOHEXOL 300 MG/ML IV SOLN   Comparison: None.   Findings: Mild bibasilar atelectasis is noted.   The liver and spleen are unremarkable in appearance.  The gallbladder is within normal limits.  The pancreas and adrenal glands are unremarkable.  The kidneys are unremarkable in appearance.  There is no evidence of hydronephrosis.  No renal or ureteral stones are seen.  No perinephric stranding is appreciated.   No free fluid is identified.  The small bowel is unremarkable in appearance.  The stomach is within normal limits.  No acute vascular abnormalities are seen.   The appendix is normal in caliber and contains air, without evidence for appendicitis.  Scattered diverticulosis is noted along the ascending colon, without evidence for diverticulitis.   Two small anterior abdominal wall hernias are noted superior to the umbilicus, containing only fat, with minimal soft tissue stranding.   The bladder is relatively decompressed; bladder wall thickening may reflect cystitis.  The prostate remains normal in size.  No inguinal lymphadenopathy is seen.   No acute osseous abnormalities are identified.  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   IMPRESSION:   1.  Diffuse bladder wall thickening may reflect cystitis. 2.  Two small anterior  abdominal wall hernias noted superior to the umbilicus, containing only fat, with minimal soft tissue inflammation. 3.  Scattered diverticulosis along the ascending colon, without evidence for diverticulitis. 4.  Mild bibasilar atelectasis noted. 5.  Chronic bilateral pars defects at L5, with minimal grade 1 anterolisthesis of L5 on S1.     Assessment & Plan:  Warren Thomas is a 36 yo male with PMH of intermittent episodes of epigastric pain, nausea and vomiting who seen in consultation at the request of Dr. Yetta Barre for evaluation of the same.  1. GERD/hx of esophagitis/gastritis -- the patient does have a history of gastritis, along with severe erosive esophagitis, as documented by EGD in 2010. Given this prior abnormality and the episodic attacks that he has described, I think repeating the upper endoscopy is reasonable. We discussed this test today, including the risks and benefits and he is agreeable to proceed. I would like him to continue with Dexilant 60 mg daily. He is advised to take this 30 minutes to one hour before his first meal of the day. See #2  2. Episodic epigastric pain/nausea/vomiting -- the etiology of the patient's episodic severe epigastric pain with nausea and vomiting is unclear. I'm not certain that these episodes can be explained by acid peptic disease alone. In reviewing his records and imaging studies, his CT abdomen was unrevealing as to the cause of these painful attacks. It does appear that he has right-sided diverticulosis, but he did not have evidence for diverticulitis. The CT also raised the possibility of cystitis, and he denies urinary symptoms now or during the attacks, making this an unlikely explanation.  He was noted to have a profound leukocytosis, 26,000 during an attack in January. He also has had electrolyte disturbances, in keeping with dehydration during these attacks. I will give him a prescription for Zofran ODT to be used early in these attacks, which  hopefully will help with some of the nausea and vomiting. I also, would like to repeat labs if and when he has a recurrent attack. I do not have an explanation at present, and he does not have any family history given his adoption to help guide [place]. Rare conditions such as familial Mediterranean fever or acute intermittent porphyria come to mind when thinking about episodic abdominal pain with nausea and vomiting. We will proceed with upper endoscopy, and consider further testing for more rare disorders which can cause episodic problems if and when the attacks recur.  He is happy with this plan

## 2011-06-21 ENCOUNTER — Ambulatory Visit (AMBULATORY_SURGERY_CENTER): Payer: BC Managed Care – PPO | Admitting: Internal Medicine

## 2011-06-21 ENCOUNTER — Encounter: Payer: Self-pay | Admitting: Internal Medicine

## 2011-06-21 VITALS — BP 127/90 | HR 93 | Temp 98.3°F | Resp 15 | Ht 69.0 in | Wt 191.0 lb

## 2011-06-21 DIAGNOSIS — R110 Nausea: Secondary | ICD-10-CM

## 2011-06-21 DIAGNOSIS — D1330 Benign neoplasm of unspecified part of small intestine: Secondary | ICD-10-CM

## 2011-06-21 DIAGNOSIS — K2990 Gastroduodenitis, unspecified, without bleeding: Secondary | ICD-10-CM

## 2011-06-21 DIAGNOSIS — R1013 Epigastric pain: Secondary | ICD-10-CM

## 2011-06-21 DIAGNOSIS — K279 Peptic ulcer, site unspecified, unspecified as acute or chronic, without hemorrhage or perforation: Secondary | ICD-10-CM

## 2011-06-21 DIAGNOSIS — K219 Gastro-esophageal reflux disease without esophagitis: Secondary | ICD-10-CM

## 2011-06-21 DIAGNOSIS — K2270 Barrett's esophagus without dysplasia: Secondary | ICD-10-CM

## 2011-06-21 DIAGNOSIS — K2970 Gastritis, unspecified, without bleeding: Secondary | ICD-10-CM

## 2011-06-21 MED ORDER — SODIUM CHLORIDE 0.9 % IV SOLN: 500.0000 mL | INTRAVENOUS | Status: DC

## 2011-06-21 NOTE — Patient Instructions (Signed)
PLEASE TAKE YOUR PPI , STOMACH MEDICATION, 20-30 MINUTES PRIOR TO BREAKFAST MEAL.  YOU HAD AN ENDOSCOPIC PROCEDURE TODAY AT THE [facility]: Refer to the procedure report that was given to you for any specific questions about what was found during the examination.  If the procedure report does not answer your questions, please call your gastroenterologist to clarify.  If you requested that your care partner not be given the details of your procedure findings, then the procedure report has been included in a sealed envelope for you to review at your convenience later.  YOU SHOULD EXPECT: Some feelings of bloating in the abdomen. Passage of more gas than usual.  Walking can help get rid of the air that was put into your GI tract during the procedure and reduce the bloating. If you had a lower endoscopy (such as a colonoscopy or flexible sigmoidoscopy) you may notice spotting of blood in your stool or on the toilet paper. If you underwent a bowel prep for your procedure, then you may not have a normal bowel movement for a few days.  DIET: Your first meal following the procedure should be a light meal and then it is ok to progress to your normal diet.  A half-sandwich or bowl of soup is an example of a good first meal.  Heavy or fried foods are harder to digest and may make you feel nauseous or bloated.  Likewise meals heavy in dairy and vegetables can cause extra gas to form and this can also increase the bloating.  Drink plenty of fluids but you should avoid alcoholic beverages for 24 hours.  ACTIVITY: Your care partner should take you home directly after the procedure.  You should plan to take it easy, moving slowly for the rest of the day.  You can resume normal activity the day after the procedure however you should NOT DRIVE or use heavy machinery for 24 hours (because of the sedation medicines used during the test).    SYMPTOMS TO REPORT IMMEDIATELY: A gastroenterologist can be reached at  any hour.  During normal business hours, 8:30 AM to 5:00 PM Monday through Friday, call [phone].  After hours and on weekends, please call the GI answering service at [phone] who will take a message and have the physician on call contact you.   Following lower endoscopy (colonoscopy or flexible sigmoidoscopy):  Excessive amounts of blood in the stool  Significant tenderness or worsening of abdominal pains  Swelling of the abdomen that is new, acute  Fever of 100F or higher  Following upper endoscopy (EGD)  Vomiting of blood or coffee ground material  New chest pain or pain under the shoulder blades  Painful or persistently difficult swallowing  New shortness of breath  Fever of 100F or higher  Black, tarry-looking stools  FOLLOW UP: If any biopsies were taken you will be contacted by phone or by letter within the next 1-3 weeks.  Call your gastroenterologist if you have not heard about the biopsies in 3 weeks.  Our staff will call the home number listed on your records the next business day following your procedure to check on you and address any questions or concerns that you may have at that time regarding the information given to you following your procedure. This is a courtesy call and so if there is no answer at the home number and we have not heard from you through the emergency physician on call, we will assume that you have  returned to your regular daily activities without incident.  SIGNATURES/CONFIDENTIALITY: You and/or your care partner have signed paperwork which will be entered into your electronic medical record.  These signatures attest to the fact that that the information above on your After Visit Summary has been reviewed and is understood.  Full responsibility of the confidentiality of this discharge information lies with you and/or your care-partner. YOU HAD AN ENDOSCOPIC PROCEDURE TODAY AT THE [facility]: Refer to the procedure report that was  given to you for any specific questions about what was found during the examination.  If the procedure report does not answer your questions, please call your gastroenterologist to clarify.  If you requested that your care partner not be given the details of your procedure findings, then the procedure report has been included in a sealed envelope for you to review at your convenience later.  YOU SHOULD EXPECT: Some feelings of bloating in the abdomen. Passage of more gas than usual.  Walking can help get rid of the air that was put into your GI tract during the procedure and reduce the bloating. If you had a lower endoscopy (such as a colonoscopy or flexible sigmoidoscopy) you may notice spotting of blood in your stool or on the toilet paper. If you underwent a bowel prep for your procedure, then you may not have a normal bowel movement for a few days.  DIET: Your first meal following the procedure should be a light meal and then it is ok to progress to your normal diet.  A half-sandwich or bowl of soup is an example of a good first meal.  Heavy or fried foods are harder to digest and may make you feel nauseous or bloated.  Likewise meals heavy in dairy and vegetables can cause extra gas to form and this can also increase the bloating.  Drink plenty of fluids but you should avoid alcoholic beverages for 24 hours.  ACTIVITY: Your care partner should take you home directly after the procedure.  You should plan to take it easy, moving slowly for the rest of the day.  You can resume normal activity the day after the procedure however you should NOT DRIVE or use heavy machinery for 24 hours (because of the sedation medicines used during the test).    SYMPTOMS TO REPORT IMMEDIATELY: A gastroenterologist can be reached at any hour.  During normal business hours, 8:30 AM to 5:00 PM Monday through Friday, call [phone].  After hours and on weekends, please call the GI answering service at [phone] who  will take a message and have the physician on call contact you.   Following lower endoscopy (colonoscopy or flexible sigmoidoscopy):  Excessive amounts of blood in the stool  Significant tenderness or worsening of abdominal pains  Swelling of the abdomen that is new, acute  Fever of 100F or higher  Following upper endoscopy (EGD)  Vomiting of blood or coffee ground material  New chest pain or pain under the shoulder blades  Painful or persistently difficult swallowing  New shortness of breath  Fever of 100F or higher  Black, tarry-looking stools  FOLLOW UP: If any biopsies were taken you will be contacted by phone or by letter within the next 1-3 weeks.  Call your gastroenterologist if you have not heard about the biopsies in 3 weeks.  Our staff will call the home number listed on your records the next business day following your procedure to check on you and address any questions  or concerns that you may have at that time regarding the information given to you following your procedure. This is a courtesy call and so if there is no answer at the home number and we have not heard from you through the emergency physician on call, we will assume that you have returned to your regular daily activities without incident.  SIGNATURES/CONFIDENTIALITY: You and/or your care partner have signed paperwork which will be entered into your electronic medical record.  These signatures attest to the fact that that the information above on your After Visit Summary has been reviewed and is understood.  Full responsibility of the confidentiality of this discharge information lies with you and/or your care-partner.

## 2011-06-21 NOTE — Progress Notes (Signed)
Patient did not experience any of the following events: a burn prior to discharge; a fall within the facility; wrong site/side/patient/procedure/implant event; or a hospital transfer or hospital admission upon discharge from the facility. (G8907) Patient did not have preoperative order for IV antibiotic SSI prophylaxis. (G8918)  

## 2011-06-21 NOTE — Progress Notes (Signed)
Marrion Coy, CRNA administered propofol.  Andrey Farmer, CRNA was in the procedure room to observe Marrion Coy, CRNA. Maw

## 2011-06-21 NOTE — Op Note (Signed)
[facility] [address] [place], [place]  [address]  ENDOSCOPY PROCEDURE REPORT  PATIENT:  Warren Thomas, Warren Thomas  MR#:  [id number] BIRTHDATE:  1975/04/01, 35 yrs. old  GENDER:  male ENDOSCOPIST:  Carie Caddy. Esmirna Ravan, MD Referred by:  Etta Grandchild, M.D. PROCEDURE DATE:  06/21/2011 PROCEDURE:  EGD with biopsy, 43239 ASA CLASS:  Class II INDICATIONS:  episodic epigastric pain with nausea and vomiting, history of esophagitis, history of gastritis MEDICATIONS:    MAC sedation, administered by CRNA, propofol (Diprivan) 400 mg IV TOPICAL ANESTHETIC:  none  DESCRIPTION OF PROCEDURE:   After the risks benefits and alternatives of the procedure were thoroughly explained, informed consent was obtained.  The [facility] [id number] endoscope was introduced through the mouth and advanced to the second portion of the duodenum, without limitations.  The instrument was slowly withdrawn as the mucosa was fully examined. <<PROCEDUREIMAGES>>  5 cm inlet patch in the proximal esophagus beginning at 20 cm from the incisors. With standard forceps, a biopsy was obtained and sent to pathology.  There were columnar-type mucosal changes in the distal esophagus, that could represent Barrett's esophagus in the distal esophagus. Multiple biopsies were obtained and sent to pathology.  Mild gastritis was found in the body and the antrum of the stomach. Biopsies of the antrum and body of the stomach were obtained and sent to pathology.  The duodenal bulb and second duodenum were without abnormalities. Random biopsies were obtained and sent to pathology.    Retroflexed views revealed no abnormalities.    The scope was then withdrawn from the patient and the procedure completed.  COMPLICATIONS:  None  ENDOSCOPIC IMPRESSION: 1) Inlet patch in the proximal esophagus.  Biopsy performed and sent to pathology. 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  Biopsies performed and sent to pathology. 3) Mild  gastritis in the body and the antrum of the stomach. Biopsies performed and sent to pathology. 4) Normal examined duodenum.  Random biopsies performed and sent to pathology.  RECOMMENDATIONS: 1) Await pathology results 2) Continue taking your PPI (antiacid medicine) once daily. It is best to be taken 20-30 minutes prior to breakfast meal.  Carie Caddy. Rhea Belton, MD  CC:  Etta Grandchild, MD The Patient  n. eSIGNEDCarie Caddy. Esraa Seres at 06/21/2011 10:26 AM  Blain Pais, [phone]

## 2011-06-22 ENCOUNTER — Telehealth: Payer: Self-pay

## 2011-06-22 NOTE — Telephone Encounter (Signed)
  Follow up Call-  Call back number 06/21/2011  Post procedure Call Back phone  # [phone]  Permission to leave phone message Yes     Patient questions:  Do you have a fever, pain , or abdominal swelling? no Pain Score  0 *  Have you tolerated food without any problems? yes  Have you been able to return to your normal activities? yes  Do you have any questions about your discharge instructions: Diet   no Medications  no Follow up visit  no  Do you have questions or concerns about your Care? no  Actions: * If pain score is 4 or above: No action needed, pain <4.  Per the pt "I am fine". Maw

## 2011-06-25 ENCOUNTER — Encounter: Payer: Self-pay | Admitting: Internal Medicine

## 2011-12-07 ENCOUNTER — Encounter: Payer: Self-pay | Admitting: Internal Medicine

## 2011-12-23 ENCOUNTER — Encounter: Payer: Self-pay | Admitting: Internal Medicine

## 2012-01-13 ENCOUNTER — Ambulatory Visit (AMBULATORY_SURGERY_CENTER): Payer: BC Managed Care – PPO

## 2012-01-13 ENCOUNTER — Encounter: Payer: Self-pay | Admitting: Internal Medicine

## 2012-01-13 VITALS — Ht 69.0 in | Wt 216.3 lb

## 2012-01-13 DIAGNOSIS — Z8719 Personal history of other diseases of the digestive system: Secondary | ICD-10-CM

## 2012-01-13 DIAGNOSIS — R109 Unspecified abdominal pain: Secondary | ICD-10-CM

## 2012-01-13 MED ORDER — MOVIPREP 100 G PO SOLR: ORAL | Status: DC

## 2012-02-10 ENCOUNTER — Encounter: Payer: BC Managed Care – PPO | Admitting: Internal Medicine

## 2012-02-10 ENCOUNTER — Encounter: Payer: Self-pay | Admitting: Internal Medicine

## 2012-02-10 ENCOUNTER — Ambulatory Visit (AMBULATORY_SURGERY_CENTER): Payer: BC Managed Care – PPO | Admitting: Internal Medicine

## 2012-02-10 VITALS — BP 146/89 | HR 88 | Temp 98.3°F | Resp 13 | Ht 69.0 in | Wt 216.0 lb

## 2012-02-10 DIAGNOSIS — K209 Esophagitis, unspecified: Secondary | ICD-10-CM

## 2012-02-10 DIAGNOSIS — K2270 Barrett's esophagus without dysplasia: Secondary | ICD-10-CM

## 2012-02-10 MED ORDER — SODIUM CHLORIDE 0.9 % IV SOLN: 500.0000 mL | INTRAVENOUS | Status: DC

## 2012-02-10 NOTE — Progress Notes (Signed)
Called to room to assist during endoscopic procedure.  Patient ID and intended procedure confirmed with present staff. Received instructions for my participation in the procedure from the performing physician. ewm 

## 2012-02-10 NOTE — Progress Notes (Signed)
Patient did not have preoperative order for IV antibiotic SSI prophylaxis. (G8918)  Patient did not experience any of the following events: a burn prior to discharge; a fall within the facility; wrong site/side/patient/procedure/implant event; or a hospital transfer or hospital admission upon discharge from the facility. (G8907)  

## 2012-02-10 NOTE — Patient Instructions (Addendum)
YOU HAD AN ENDOSCOPIC PROCEDURE TODAY AT THE [facility]: Refer to the procedure report that was given to you for any specific questions about what was found during the examination.  If the procedure report does not answer your questions, please call your gastroenterologist to clarify.  If you requested that your care partner not be given the details of your procedure findings, then the procedure report has been included in a sealed envelope for you to review at your convenience later.  YOU SHOULD EXPECT: Some feelings of bloating in the abdomen. Passage of more gas than usual.  Walking can help get rid of the air that was put into your GI tract during the procedure and reduce the bloating. If you had a lower endoscopy (such as a colonoscopy or flexible sigmoidoscopy) you may notice spotting of blood in your stool or on the toilet paper. If you underwent a bowel prep for your procedure, then you may not have a normal bowel movement for a few days.  DIET: Your first meal following the procedure should be a light meal and then it is ok to progress to your normal diet.  A half-sandwich or bowl of soup is an example of a good first meal.  Heavy or fried foods are harder to digest and may make you feel nauseous or bloated.  Likewise meals heavy in dairy and vegetables can cause extra gas to form and this can also increase the bloating.  Drink plenty of fluids but you should avoid alcoholic beverages for 24 hours.  ACTIVITY: Your care partner should take you home directly after the procedure.  You should plan to take it easy, moving slowly for the rest of the day.  You can resume normal activity the day after the procedure however you should NOT DRIVE or use heavy machinery for 24 hours (because of the sedation medicines used during the test).    SYMPTOMS TO REPORT IMMEDIATELY: A gastroenterologist can be reached at any hour.  During normal business hours, 8:30 AM to 5:00 PM Monday through Friday,  call [phone].  After hours and on weekends, please call the GI answering service at [phone] who will take a message and have the physician on call contact you.   Following upper endoscopy (EGD)  Vomiting of blood or coffee ground material  New chest pain or pain under the shoulder blades  Painful or persistently difficult swallowing  New shortness of breath  Fever of 100F or higher  Black, tarry-looking stools  FOLLOW UP: If any biopsies were taken you will be contacted by phone or by letter within the next 1-3 weeks.  Call your gastroenterologist if you have not heard about the biopsies in 3 weeks.  Our staff will call the home number listed on your records the next business day following your procedure to check on you and address any questions or concerns that you may have at that time regarding the information given to you following your procedure. This is a courtesy call and so if there is no answer at the home number and we have not heard from you through the emergency physician on call, we will assume that you have returned to your regular daily activities without incident.  Thank-you for choosing us for your healthcare needs.  SIGNATURES/CONFIDENTIALITY: You and/or your care partner have signed paperwork which will be entered into your electronic medical record.  These signatures attest to the fact that that the information above on your After Visit Summary   has been reviewed and is understood.  Full responsibility of the confidentiality of this discharge information lies with you and/or your care-partner.  

## 2012-02-11 ENCOUNTER — Telehealth: Payer: Self-pay | Admitting: *Deleted

## 2012-02-11 NOTE — Telephone Encounter (Signed)
No answer, left message to call if questions or concerns. 

## 2012-02-11 NOTE — Op Note (Signed)
 [facility] [address] [place], [address]   ENDOSCOPY PROCEDURE REPORT  PATIENT: Warren Thomas, Warren Thomas  MR#: [id number] BIRTHDATE: Oct 30, 1975 , 36  yrs. old GENDER: Male ENDOSCOPIST: Beverley Fiedler, MD PROCEDURE DATE:  02/10/2012 PROCEDURE:  EGD w/ biopsy ASA CLASS:     Class II INDICATIONS:  follow up of Barrett's esophagus. MEDICATIONS: MAC sedation, administered by CRNA, Propofol (Diprivan), and Propofol (Diprivan) 360 mg IV TOPICAL ANESTHETIC: Cetacaine Spray  DESCRIPTION OF PROCEDURE: After the risks benefits and alternatives of the procedure were thoroughly explained, informed consent was obtained.  The LB [id number]-H180 [id number] endoscope was introduced through the mouth and advanced to the second portion of the duodenum. Without limitations.  The instrument was slowly withdrawn as the mucosa was fully examined.     ESOPHAGUS: Inlet patch was again observed on opposing walls of the proximal esophagus at 17-20 cm from the incisors. Multiple biopsies were obtained  and placed in a separate jar.   There was evidence of Barrett's esophagus in the lower third of the esophagus extending from 35 cm to 37 cm from the incisors.  Prague criteria C1 M3.  Multiple biopsies were performed using cold forceps  and placed in 2 jars ( 35 cm and 37 cm from the incisors).   Sample sent for histology.  STOMACH: A 2 cm hiatus hernia was found.   The mucosa of the stomach appeared normal.  DUODENUM: The duodenal mucosa showed no abnormalities in the bulb and second portion of the duodenum. Retroflexed views revealed a hiatal hernia.     The scope was then withdrawn from the patient and the procedure completed.  COMPLICATIONS: There were no complications. ENDOSCOPIC IMPRESSION: 1.   Inlet patch 2.   There was evidence of Barrett's esophagus; multiple biopsies 3.   Hiatus hernia was found 4.   The mucosa of the stomach appeared normal 5.   The duodenal mucosa showed no  abnormalities in the bulb and second portion of the duodenum  RECOMMENDATIONS: 1.  Await pathology results 2.  Continue taking your PPI (antiacid medicine) once daily.  It is best to be taken 20-30 minutes prior to breakfast meal. 3.  Repeat EGD for Barrett's surveillance based on pathology results.   eSigned:  Beverley Fiedler, MD 02/10/2012 12:30 PM   [id number] Karsten Ro, MD and The Patient  PATIENT NAME:  [name], [name] MR#: [id number]

## 2012-02-14 ENCOUNTER — Telehealth: Payer: Self-pay | Admitting: Internal Medicine

## 2012-02-14 DIAGNOSIS — M549 Dorsalgia, unspecified: Secondary | ICD-10-CM

## 2012-02-14 DIAGNOSIS — R079 Chest pain, unspecified: Secondary | ICD-10-CM

## 2012-02-14 MED ORDER — HYOSCYAMINE SULFATE 0.125 MG SL SUBL: 0.1250 mg | SUBLINGUAL_TABLET | Freq: Four times a day (QID) | SUBLINGUAL | Status: DC | PRN

## 2012-02-14 NOTE — Telephone Encounter (Signed)
Informed pt of Dr Lauro Franklin recommendations; ordered Levsin and pt will come tomorrow for his Chest Xray.

## 2012-02-14 NOTE — Telephone Encounter (Signed)
Pt had EGD and a COLON prep on 02/10/12. He reports a dull pain on and off in his back between his shoulder blades.. He also has a pinching/cramping pain that comes and goes in his "colon" and a " small hair line" pain in his chest that goes down his l arm to his elbow. I asked if he is passing gas and he states he is belching and has flatus; he is having BMs. Pt states he should have called Friday and didn't, but his wife insisted he call today. Please advise. Thanks.

## 2012-02-14 NOTE — Telephone Encounter (Signed)
Likely related to procedure and I do not think it represents a serious complication. However, 2v CXR, is reasonable to confirm to abnormalities after EGD with biopsies. He should continue his PPI. The levsin may help his lower cramping pains

## 2012-02-15 ENCOUNTER — Ambulatory Visit (INDEPENDENT_AMBULATORY_CARE_PROVIDER_SITE_OTHER)
Admission: RE | Admit: 2012-02-15 | Discharge: 2012-02-15 | Disposition: A | Discharge status: Home / Self Care | Payer: BC Managed Care – PPO | Source: Ambulatory Visit | Attending: Internal Medicine | Admitting: Internal Medicine

## 2012-02-15 DIAGNOSIS — M549 Dorsalgia, unspecified: Secondary | ICD-10-CM

## 2012-02-15 DIAGNOSIS — R079 Chest pain, unspecified: Secondary | ICD-10-CM

## 2012-02-16 ENCOUNTER — Encounter: Payer: Self-pay | Admitting: Internal Medicine

## 2012-02-16 NOTE — Telephone Encounter (Signed)
Could give a trial of oral sucralfate 1 g 3 times a day a.c. and at bedtime, also could try simethicone for gas relief If this continues he should see our office or Dr. Yetta Barre

## 2012-02-16 NOTE — Telephone Encounter (Signed)
Pt left me a message asking me to leave results on his phone which I did. I also asked him to call back to inform us if his s&s were getting better. Pt left a message that he continues to have pain in L shoulder blade that moves to the R shoulder blade. Also, the pain in the center of his chest that moves to his L arm then radiates to his elbow is still there. Any advice? Thanks.

## 2012-02-17 MED ORDER — SUCRALFATE 1 GM/10ML PO SUSP: 1.0000 g | Freq: Three times a day (TID) | ORAL | Status: DC

## 2012-02-17 NOTE — Telephone Encounter (Signed)
Pt left a message requesting the carafate be ordered; he is still uncomfortable.

## 2012-02-17 NOTE — Telephone Encounter (Signed)
lmom for pt that I ordered the carafate liquid per Dr Rhea Belton; he had tabs ordered in 2/13. I also left a note for the pharmacist to give him OTC Simethicone.

## 2012-02-17 NOTE — Addendum Note (Signed)
Addended by: Florene Glen on: 02/17/2012 03:11 PM   Modules accepted: Orders

## 2012-02-17 NOTE — Telephone Encounter (Signed)
lmom for pt to call back

## 2012-06-25 ENCOUNTER — Emergency Department (HOSPITAL_COMMUNITY): Payer: BC Managed Care – PPO

## 2012-06-25 ENCOUNTER — Encounter (HOSPITAL_COMMUNITY): Payer: Self-pay | Admitting: Emergency Medicine

## 2012-06-25 ENCOUNTER — Emergency Department (HOSPITAL_COMMUNITY)
Admission: EM | Admit: 2012-06-25 | Discharge: 2012-06-25 | Disposition: A | Discharge status: Home / Self Care | Payer: BC Managed Care – PPO | Attending: Emergency Medicine | Admitting: Emergency Medicine

## 2012-06-25 DIAGNOSIS — Z8659 Personal history of other mental and behavioral disorders: Secondary | ICD-10-CM | POA: Insufficient documentation

## 2012-06-25 DIAGNOSIS — R197 Diarrhea, unspecified: Secondary | ICD-10-CM | POA: Insufficient documentation

## 2012-06-25 DIAGNOSIS — K219 Gastro-esophageal reflux disease without esophagitis: Secondary | ICD-10-CM | POA: Insufficient documentation

## 2012-06-25 DIAGNOSIS — R1031 Right lower quadrant pain: Secondary | ICD-10-CM | POA: Insufficient documentation

## 2012-06-25 DIAGNOSIS — Z8719 Personal history of other diseases of the digestive system: Secondary | ICD-10-CM | POA: Insufficient documentation

## 2012-06-25 DIAGNOSIS — R109 Unspecified abdominal pain: Secondary | ICD-10-CM

## 2012-06-25 DIAGNOSIS — Z79899 Other long term (current) drug therapy: Secondary | ICD-10-CM | POA: Insufficient documentation

## 2012-06-25 DIAGNOSIS — Z8711 Personal history of peptic ulcer disease: Secondary | ICD-10-CM | POA: Insufficient documentation

## 2012-06-25 DIAGNOSIS — R1011 Right upper quadrant pain: Secondary | ICD-10-CM | POA: Insufficient documentation

## 2012-06-25 DIAGNOSIS — Z87891 Personal history of nicotine dependence: Secondary | ICD-10-CM | POA: Insufficient documentation

## 2012-06-25 LAB — URINALYSIS, ROUTINE W REFLEX MICROSCOPIC
Glucose, UA: NEGATIVE mg/dL
Hgb urine dipstick: NEGATIVE
Ketones, ur: 15 mg/dL — AB
Protein, ur: 30 mg/dL — AB
Urobilinogen, UA: 0.2 mg/dL (ref 0.0–1.0)

## 2012-06-25 LAB — CBC WITH DIFFERENTIAL/PLATELET
Basophils Absolute: 0 10*3/uL (ref 0.0–0.1)
Eosinophils Absolute: 0.2 10*3/uL (ref 0.0–0.7)
Lymphs Abs: 1.1 10*3/uL (ref 0.7–4.0)
MCH: 27.1 pg (ref 26.0–34.0)
Neutrophils Relative %: 85 % — ABNORMAL HIGH (ref 43–77)
Platelets: 302 10*3/uL (ref 150–400)
RBC: 6.27 MIL/uL — ABNORMAL HIGH (ref 4.22–5.81)
RDW: 13 % (ref 11.5–15.5)
WBC: 14.3 10*3/uL — ABNORMAL HIGH (ref 4.0–10.5)

## 2012-06-25 LAB — URINE MICROSCOPIC-ADD ON

## 2012-06-25 LAB — COMPREHENSIVE METABOLIC PANEL
ALT: 17 U/L (ref 0–53)
AST: 16 U/L (ref 0–37)
Albumin: 4.3 g/dL (ref 3.5–5.2)
Alkaline Phosphatase: 62 U/L (ref 39–117)
Calcium: 9.4 mg/dL (ref 8.4–10.5)
Glucose, Bld: 119 mg/dL — ABNORMAL HIGH (ref 70–99)
Potassium: 3.9 mEq/L (ref 3.5–5.1)
Sodium: 140 mEq/L (ref 135–145)
Total Protein: 7.9 g/dL (ref 6.0–8.3)

## 2012-06-25 MED ORDER — SODIUM CHLORIDE 0.9 % IV SOLN
Freq: Once | INTRAVENOUS | Status: AC
  Administered 2012-06-25: 07:00:00 via INTRAVENOUS

## 2012-06-25 MED ORDER — IOHEXOL 300 MG/ML  SOLN
50.0000 mL | Freq: Once | INTRAMUSCULAR | Status: AC | PRN
  Administered 2012-06-25: 50 mL via ORAL

## 2012-06-25 MED ORDER — MORPHINE SULFATE 4 MG/ML IJ SOLN
4.0000 mg | Freq: Once | INTRAMUSCULAR | Status: AC
  Administered 2012-06-25: 4 mg via INTRAVENOUS
  Filled 2012-06-25: qty 1

## 2012-06-25 MED ORDER — HYDROCODONE-ACETAMINOPHEN 5-325 MG PO TABS: 1.0000 | ORAL_TABLET | ORAL | Status: DC | PRN

## 2012-06-25 MED ORDER — CHOLESTYRAMINE 4 G PO PACK: 1.0000 | PACK | Freq: Three times a day (TID) | ORAL | Status: DC

## 2012-06-25 MED ORDER — MORPHINE SULFATE 4 MG/ML IJ SOLN: 4.0000 mg | Freq: Once | INTRAMUSCULAR | Status: DC

## 2012-06-25 MED ORDER — IOHEXOL 300 MG/ML  SOLN
100.0000 mL | Freq: Once | INTRAMUSCULAR | Status: AC | PRN
  Administered 2012-06-25: 100 mL via INTRAVENOUS

## 2012-06-25 MED ORDER — ONDANSETRON HCL 4 MG/2ML IJ SOLN
4.0000 mg | Freq: Once | INTRAMUSCULAR | Status: AC
  Administered 2012-06-25: 4 mg via INTRAVENOUS
  Filled 2012-06-25: qty 2

## 2012-06-25 MED ORDER — ONDANSETRON HCL 4 MG PO TABS: 4.0000 mg | ORAL_TABLET | Freq: Three times a day (TID) | ORAL | Status: DC | PRN

## 2012-06-25 NOTE — ED Notes (Addendum)
Abd. Pain. Rt. Upper side, radiates to lower rt. Side. No hx of kidney stones. Diarrhea all day yesterday.

## 2012-06-25 NOTE — ED Provider Notes (Signed)
History     CSN: [id number]  Arrival date & time 06/25/12  0409   First MD Initiated Contact with Patient 06/25/12 [phone]      Chief Complaint  Patient presents with  . Abdominal Pain    (Consider location/radiation/quality/duration/timing/severity/associated sxs/prior treatment) HPI  37 year old male with a past medical history significant for Barrett's esophagus, previous peptic ulcer disease, diverticulitis presents to the emergency department with chief complaint of abdominal pain.  Patient states that he began having pain in his right upper quadrant yesterday that progressed into his right lower quadrant and is now constant in that area.  He has had light-colored watery diarrhea for the past week.  Yesterday he noticed coffee ground material in his stools. He denies contacts with similar sxs, ingestion of suspect foods or water, history of similar sxs, recent foreign travel .  Patient denies any blood or mucus in his stool.  He has had nausea without vomiting.  Patient has been using Tylenol, Emetrol and Tikosyn mean with out relief of his symptoms Denies fevers, chills, myalgias, arthralgias. Denies DOE, SOB, chest tightness or pressure, radiation to left arm, jaw or back, or diaphoresis. Denies dysuria, flank pain, suprapubic pain, frequency, urgency, or hematuria.    Past Medical History  Diagnosis Date  . Stomach ulcer   . Diverticulitis   . GERD (gastroesophageal reflux disease)   . Anxiety     Past Surgical History  Procedure Laterality Date  . Upper gastrointestinal endoscopy      Family History  Problem Relation Age of Onset  . Adopted: Yes    History  Substance Use Topics  . Smoking status: Former Smoker    Quit date: 06/17/2010  . Smokeless tobacco: Never Used  . Alcohol Use: 1.2 oz/week    2 Cans of beer per week     Comment: occasional      Review of Systems Ten systems reviewed and are negative for acute change, except as noted in the HPI.    Allergies  Penicillins  Home Medications   Current Outpatient Rx  Name  Route  Sig  Dispense  Refill  . acetaminophen (TYLENOL) 500 MG tablet   Oral   Take 1,000 mg by mouth every 6 (six) hours as needed for pain.         Marland Kitchen anti-nausea (EMETROL) solution   Oral   Take 30 mLs by mouth every 15 (fifteen) minutes as needed for nausea.         . hyoscyamine (LEVSIN SL) 0.125 MG SL tablet   Sublingual   Place 1 tablet (0.125 mg total) under the tongue every 6 (six) hours as needed for cramping.   30 tablet   0   . omeprazole (PRILOSEC) 40 MG capsule   Oral   Take 40 mg by mouth daily.           BP 136/92  Pulse 92  Temp(Src) 98.6 F (37 C) (Oral)  Resp 18  SpO2 98%  Physical Exam Physical Exam  Nursing note and vitals reviewed. Constitutional: He appears well-developed and well-nourished.  Very uncomfortable. HENT:  Head: Normocephalic and atraumatic.  Eyes: Conjunctivae normal are normal. No scleral icterus.  Neck: Normal range of motion. Neck supple.  Cardiovascular: Normal rate, regular rhythm and normal heart sounds.   Pulmonary/Chest: Effort normal and breath sounds normal. No respiratory distress.  Abdominal: Soft.  There is no rigidity or guarding.  Bowel sounds are normal.  The patient has umbilical hernia present.  He  is moderately tender to palpation in the right lower cautery.  Negative rebound, negative so as her obturator sign.  No peritoneal signs.   Musculoskeletal: He exhibits no edema.  Neurological: He is alert.  Skin: Skin is warm and dry. He is not diaphoretic.  Psychiatric: His behavior is normal.    ED Course  Procedures (including critical care time)  Labs Reviewed  URINALYSIS, ROUTINE W REFLEX MICROSCOPIC - Abnormal; Notable for the following:    Specific Gravity, Urine 1.035 (*)    Ketones, ur 15 (*)    Protein, ur 30 (*)    All other components within normal limits  URINE MICROSCOPIC-ADD ON - Abnormal; Notable for the  following:    Squamous Epithelial / LPF FEW (*)    Bacteria, UA FEW (*)    All other components within normal limits  CBC WITH DIFFERENTIAL - Abnormal; Notable for the following:    WBC 14.3 (*)    RBC 6.27 (*)    Neutrophils Relative 85 (*)    Neutro Abs 12.1 (*)    Lymphocytes Relative 8 (*)    All other components within normal limits  COMPREHENSIVE METABOLIC PANEL - Abnormal; Notable for the following:    Glucose, Bld 119 (*)    GFR calc non Af Amer 68 (*)    GFR calc Af Amer 79 (*)    All other components within normal limits  LIPASE, BLOOD   No results found.   1. Abdominal pain   2. Diarrhea       MDM  7:11 AM Filed Vitals:   06/25/12 0708  BP: 140/89  Pulse: 94  Temp:   Resp: 16   Shin with a complicated gastrointestinal history.  He is followed by Dr. Alger Memos. Patient has had multiple workups for his previous symptoms including to endoscopies.  Patient states that it never had this type of pain and has never been on his right side. I have ordered basic labs for evaluation and CT scan of the abdomen to rule out developing appendicitis.  He is afebrile and vital signs are stable.  I've offered him pain medications.  It appears to have an elevated blood pressure although I feel that this is an inaccurate reading and will reevaluate shortly.  9:00 AM Filed Vitals:   06/25/12 0411 06/25/12 0531 06/25/12 0708 06/25/12 0723  BP: 140/105 136/92 140/89 140/93  Pulse: 99 92 94   Temp: 98.2 F (36.8 C) 98.6 F (37 C)    TempSrc:  Oral    Resp: 24 18 16    SpO2: 99% 98% 98%    CT scan negative for acute abnormality. He continues to have pain although it is decreased. I will discharge the patient with cholestyramine, zofran and pain meds. I have suggested that the patient may need to get a stool culture if his diarrhea continues. He has had no episodes while here at the ED. I have also suggested that he use Culturelle.       Arthor Captain, PA-C 06/25/12  1012

## 2012-06-27 NOTE — ED Provider Notes (Signed)
Medical screening examination/treatment/procedure(s) were performed by non-physician practitioner and as supervising physician I was immediately available for consultation/collaboration.  Geoffery Lyons, MD 06/27/12 2042

## 2013-02-27 ENCOUNTER — Ambulatory Visit (INDEPENDENT_AMBULATORY_CARE_PROVIDER_SITE_OTHER): Payer: BC Managed Care – PPO | Admitting: Emergency Medicine

## 2013-02-27 ENCOUNTER — Encounter: Payer: Self-pay | Admitting: Emergency Medicine

## 2013-02-27 VITALS — BP 142/98 | HR 104 | Temp 101.6°F | Resp 18 | Ht 68.5 in | Wt 234.0 lb

## 2013-02-27 DIAGNOSIS — J09X2 Influenza due to identified novel influenza A virus with other respiratory manifestations: Secondary | ICD-10-CM

## 2013-02-27 DIAGNOSIS — R509 Fever, unspecified: Secondary | ICD-10-CM

## 2013-02-27 MED ORDER — BENZONATATE 100 MG PO CAPS: 100.0000 mg | ORAL_CAPSULE | Freq: Three times a day (TID) | ORAL | Status: DC | PRN

## 2013-02-27 MED ORDER — HYDROCODONE-ACETAMINOPHEN 5-325 MG PO TABS: 1.0000 | ORAL_TABLET | Freq: Four times a day (QID) | ORAL | Status: DC | PRN

## 2013-02-27 MED ORDER — AZITHROMYCIN 250 MG PO TABS: ORAL_TABLET | ORAL | Status: AC

## 2013-02-27 MED ORDER — PREDNISONE 10 MG PO TABS: ORAL_TABLET | ORAL | Status: DC

## 2013-02-27 NOTE — Patient Instructions (Signed)

## 2013-02-27 NOTE — Progress Notes (Signed)
   Subjective:    Patient ID: Warren Thomas, male    DOB: 10-13-1975, 37 y.o.   MRN: [id number]  HPI Comments: 37 yo male with Cough x 2 days but has noticed increased congestion. Fever of >101 yesterday + Fatigue/ Myalgia/ arthralgias/ nausea. Increased headache.  Cough Associated symptoms include a fever and myalgias.  Fever  Associated symptoms include coughing.    Current Outpatient Prescriptions on File Prior to Visit  Medication Sig Dispense Refill  . acetaminophen (TYLENOL) 500 MG tablet Take 1,000 mg by mouth every 6 (six) hours as needed for pain.       No current facility-administered medications on file prior to visit.   ALLERGIES Penicillins  Past Medical History  Diagnosis Date  . Stomach ulcer   . Diverticulitis   . GERD (gastroesophageal reflux disease)   . Anxiety      Review of Systems  Constitutional: Positive for fever.  Respiratory: Positive for cough.   Musculoskeletal: Positive for arthralgias and myalgias.  BP 142/98  Pulse 104  Temp(Src) 101.6 F (38.7 C) (Oral)  Resp 18  Ht 5' 8.5" (1.74 m)  Wt 234 lb (106.142 kg)  BMI 35.06 kg/m2      Objective:   Physical Exam  Nursing note and vitals reviewed. Constitutional: He is oriented to person, place, and time. He appears well-developed and well-nourished.  Appears ill.  HENT:  Head: Normocephalic and atraumatic.  Right Ear: External ear normal.  Left Ear: External ear normal.  Nose: Nose normal.  Mouth/Throat: No oropharyngeal exudate.  Eyes: Conjunctivae and EOM are normal.  Neck: Normal range of motion. Neck supple. No JVD present. No thyromegaly present.  Cardiovascular: Normal rate, regular rhythm, normal heart sounds and intact distal pulses.   Pulmonary/Chest: Effort normal and breath sounds normal.  Congested cough  Abdominal: Soft. Bowel sounds are normal. He exhibits no distension and no mass. There is no tenderness. There is no rebound and no guarding.  Musculoskeletal:  Normal range of motion. He exhibits no edema and no tenderness.  Lymphadenopathy:    He has no cervical adenopathy.  Neurological: He is alert and oriented to person, place, and time. He has normal reflexes. No cranial nerve deficit. Coordination normal.  Skin: Skin is warm and dry.  Psychiatric: He has a normal mood and affect. His behavior is normal. Judgment and thought content normal.      Flu a +    Assessment & Plan:  Influenza A- If sx increase ER. Push fluids, Tylenol q8 hours x 2days, Pred DP 10 mg/ Tessalon perles both AD, if any color to production Zpak AD. OOW x 5 days or fever free x 24 hours.

## 2013-04-02 DIAGNOSIS — F419 Anxiety disorder, unspecified: Secondary | ICD-10-CM | POA: Insufficient documentation

## 2013-04-02 DIAGNOSIS — K2270 Barrett's esophagus without dysplasia: Secondary | ICD-10-CM | POA: Insufficient documentation

## 2013-04-02 DIAGNOSIS — K5792 Diverticulitis of intestine, part unspecified, without perforation or abscess without bleeding: Secondary | ICD-10-CM | POA: Insufficient documentation

## 2013-04-02 DIAGNOSIS — K219 Gastro-esophageal reflux disease without esophagitis: Secondary | ICD-10-CM | POA: Insufficient documentation

## 2013-04-03 ENCOUNTER — Encounter: Payer: Self-pay | Admitting: Physician Assistant

## 2013-04-03 ENCOUNTER — Ambulatory Visit (INDEPENDENT_AMBULATORY_CARE_PROVIDER_SITE_OTHER): Payer: BC Managed Care – PPO | Admitting: Physician Assistant

## 2013-04-03 VITALS — BP 138/98 | HR 88 | Temp 98.6°F | Resp 16 | Ht 68.5 in | Wt 234.0 lb

## 2013-04-03 DIAGNOSIS — R7303 Prediabetes: Secondary | ICD-10-CM

## 2013-04-03 DIAGNOSIS — Z0000 Encounter for general adult medical examination without abnormal findings: Secondary | ICD-10-CM

## 2013-04-03 DIAGNOSIS — Z79899 Other long term (current) drug therapy: Secondary | ICD-10-CM

## 2013-04-03 DIAGNOSIS — E782 Mixed hyperlipidemia: Secondary | ICD-10-CM

## 2013-04-03 DIAGNOSIS — R7309 Other abnormal glucose: Secondary | ICD-10-CM

## 2013-04-03 DIAGNOSIS — E785 Hyperlipidemia, unspecified: Secondary | ICD-10-CM | POA: Insufficient documentation

## 2013-04-03 DIAGNOSIS — R5383 Other fatigue: Secondary | ICD-10-CM

## 2013-04-03 DIAGNOSIS — E559 Vitamin D deficiency, unspecified: Secondary | ICD-10-CM

## 2013-04-03 DIAGNOSIS — R5381 Other malaise: Secondary | ICD-10-CM

## 2013-04-03 DIAGNOSIS — I10 Essential (primary) hypertension: Secondary | ICD-10-CM

## 2013-04-03 LAB — HEMOGLOBIN A1C
Hgb A1c MFr Bld: 6 % — ABNORMAL HIGH (ref <5.7)
Mean Plasma Glucose: 126 mg/dL — ABNORMAL HIGH (ref <117)

## 2013-04-03 MED ORDER — ALPRAZOLAM 1 MG PO TABS
1.0000 mg | ORAL_TABLET | Freq: Every evening | ORAL | Status: DC | PRN
Start: 2013-04-03 — End: 2013-08-14

## 2013-04-03 MED ORDER — MELOXICAM 15 MG PO TABS: 15.0000 mg | ORAL_TABLET | Freq: Every day | ORAL | Status: DC

## 2013-04-03 NOTE — Progress Notes (Signed)
HPI Patient presents for 3 month follow up with hypertension, hyperlipidemia, prediabetes and vitamin D.  Patient's blood pressure has been controlled at home, today their BP is BP: 138/98 mmHg  Patient denies chest pain, shortness of breath, dizziness.   Patient's cholesterol is diet controlled.  His cholesterol is not at goal. The cholesterol last visit was:   Lab Results  Component Value Date   CHOL 231* 06/11/2011   HDL 49.50 06/11/2011   LDLDIRECT 158.3 06/11/2011   TRIG 114.0 06/11/2011   CHOLHDL 5 06/11/2011    he has not been working on diet and exercise for preiabetes, and denies blurry vision, polydipsia, polyphagia and polyuria. His A1C is not at goal. He states if he does not eat much or if he has not eaten in a long time he will get flushed, shakey, and have to eat. Not check sugar at this time.  Last A1C in the office was:  Lab Results  Component Value Date   HGBA1C 5.7 06/11/2011   Patient is on Vitamin D supplement.    Right knee pain, feels stiff in the morning and lasts for 4 hours. He has had some weight gain. More in the front of his knee, occasional loud pop without pain, no clicking, locking, instability. GERD controlled with Nexium.  Increasing anxiety, new job.   Current Medications:  Current Outpatient Prescriptions on File Prior to Visit  Medication Sig Dispense Refill  . OVER THE COUNTER MEDICATION 20 mg 2 (two) times daily. OTC Nexium Rx       No current facility-administered medications on file prior to visit.   Medical History:  Past Medical History  Diagnosis Date  . Stomach ulcer   . Diverticulitis   . Diverticulitis   . Anxiety   . GERD (gastroesophageal reflux disease)   . Barrett's esophagus    Allergies:  Allergies  Allergen Reactions  . Penicillins Swelling    ROS Constitutional: + insomnia Denies fever, chills, headaches,fatigue, night sweats Eyes: Denies redness, blurred vision, diplopia, discharge, itchy, watery eyes.  ENT: Denies  congestion, post nasal drip, sore throat, earache, dental pain, Tinnitus, Vertigo, Sinus pain, snoring.  Cardio: Denies chest pain, palpitations, irregular heartbeat, dyspnea, diaphoresis, orthopnea, PND, claudication, edema Respiratory: denies cough, shortness of breath, wheezing.  Gastrointestinal: + GERD Denies dysphagia,  AB pain/ cramps, N/V, diarrhea, constipation, hematemesis, melena, hematochezia,  hemorrhoids Genitourinary: Denies dysuria, frequency, urgency, nocturia, hesitancy, discharge, hematuria, flank pain Musculoskeletal: + knee pain Denies myalgia, stiffness, swelling and strain/sprain. Skin: Denies pruritis, rash, changing in skin lesion Neuro: Denies Weakness, tremor, incoordination, spasms, pain Psychiatric: Denies confusion, memory loss, sensory loss Endocrine: Denies change in weight, skin, hair change, nocturia Diabetic Polys, Denies visual blurring, hyper /hypo glycemic episodes, and paresthesia, Heme/Lymph: Denies Excessive bleeding, bruising, enlarged lymph nodes  Family history- Review and unchanged Social history- Review and unchanged Physical Exam: Filed Vitals:   04/03/13 1612  BP: 138/98  Pulse: 88  Temp: 98.6 F (37 C)  Resp: 16   Filed Weights   04/03/13 1612  Weight: 234 lb (106.142 kg)   General Appearance: Well nourished, in no apparent distress. Eyes: PERRLA, EOMs, conjunctiva no swelling or erythema Sinuses: No Frontal/maxillary tenderness ENT/Mouth: Ext aud canals clear, TMs without erythema, bulging. No erythema, swelling, or exudate on post pharynx.  Tonsils not swollen or erythematous. Hearing normal.  Neck: Supple, thyroid normal.  Respiratory: Respiratory effort normal, BS equal bilaterally without rales, rhonchi, wheezing or stridor.  Cardio: RRR with no MRGs. Brisk peripheral  pulses without edema.  Abdomen: Soft, + BS.  Non tender, no guarding, rebound, hernias, masses. Lymphatics: Non tender without lymphadenopathy.  Musculoskeletal:  Full ROM, 5/5 strength, normal gait.  Skin: Warm, dry without rashes, lesions, ecchymosis.  Neuro: Cranial nerves intact. Normal muscle tone, no cerebellar symptoms. Sensation intact.  Psych: Awake and oriented X 3, normal affect, Insight and Judgment appropriate.   Assessment and Plan:  Hypertension: Continue medication, monitor blood pressure at home.  Continue DASH diet. Cholesterol: Continue diet and exercise. Check cholesterol.  Pre-diabetes-Continue diet and exercise. Check A1C- ? Insulin resistance Vitamin D Def- check level and continue medications.  Anxiety- Xanax 1mg  #60 Left knee pain- ? patellofemeral- exercises given, meloxicam, weight loss advised- if worse will send to ortho  Continue diet and meds as discussed. Further disposition pending results of labs.  Vicie Mutters 4:39 PM

## 2013-04-03 NOTE — Patient Instructions (Signed)
Knee Exercises EXERCISES RANGE OF MOTION(ROM) AND STRETCHING EXERCISES These exercises may help you when beginning to rehabilitate your injury. Your symptoms may resolve with or without further involvement from your physician, physical therapist or athletic trainer. While completing these exercises, remember:   Restoring tissue flexibility helps normal motion to return to the joints. This allows healthier, less painful movement and activity.  An effective stretch should be held for at least 30 seconds.  A stretch should never be painful. You should only feel a gentle lengthening or release in the stretched tissue. STRETCH - Knee Extension, Prone  Lie on your stomach on a firm surface, such as a bed or countertop. Place your right / left knee and leg just beyond the edge of the surface. You may wish to place a towel under the far end of your right / left thigh for comfort.  Relax your leg muscles and allow gravity to straighten your knee. Your clinician may advise you to add an ankle weight if more resistance is helpful for you.  You should feel a stretch in the back of your right / left knee. Hold this position for __________ seconds. Repeat __________ times. Complete this stretch __________ times per day. * Your physician, physical therapist or athletic trainer may ask you to add ankle weight to enhance your stretch.  RANGE OF MOTION - Knee Flexion, Active  Lie on your back with both knees straight. (If this causes back discomfort, bend your opposite knee, placing your foot flat on the floor.)  Slowly slide your heel back toward your buttocks until you feel a gentle stretch in the front of your knee or thigh.  Hold for __________ seconds. Slowly slide your heel back to the starting position. Repeat __________ times. Complete this exercise __________ times per day.  STRETCH - Quadriceps, Prone   Lie on your stomach on a firm surface, such as a bed or padded floor.  Bend your right /  left knee and grasp your ankle. If you are unable to reach, your ankle or pant leg, use a belt around your foot to lengthen your reach.  Gently pull your heel toward your buttocks. Your knee should not slide out to the side. You should feel a stretch in the front of your thigh and/or knee.  Hold this position for __________ seconds. Repeat __________ times. Complete this stretch __________ times per day.  STRETCH  Hamstrings, Supine   Lie on your back. Loop a belt or towel over the ball of your right / left foot.  Straighten your right / left knee and slowly pull on the belt to raise your leg. Do not allow the right / left knee to bend. Keep your opposite leg flat on the floor.  Raise the leg until you feel a gentle stretch behind your right / left knee or thigh. Hold this position for __________ seconds. Repeat __________ times. Complete this stretch __________ times per day.  STRENGTHENING EXERCISES These exercises may help you when beginning to rehabilitate your injury. They may resolve your symptoms with or without further involvement from your physician, physical therapist or athletic trainer. While completing these exercises, remember:   Muscles can gain both the endurance and the strength needed for everyday activities through controlled exercises.  Complete these exercises as instructed by your physician, physical therapist or athletic trainer. Progress the resistance and repetitions only as guided.  You may experience muscle soreness or fatigue, but the pain or discomfort you are trying to eliminate should   never worsen during these exercises. If this pain does worsen, stop and make certain you are following the directions exactly. If the pain is still present after adjustments, discontinue the exercise until you can discuss the trouble with your clinician. STRENGTH - Quadriceps, Isometrics  Lie on your back with your right / left leg extended and your opposite knee bent.  Gradually  tense the muscles in the front of your right / left thigh. You should see either your knee cap slide up toward your hip or increased dimpling just above the knee. This motion will push the back of the knee down toward the floor/mat/bed on which you are lying.  Hold the muscle as tight as you can without increasing your pain for __________ seconds.  Relax the muscles slowly and completely in between each repetition. Repeat __________ times. Complete this exercise __________ times per day.  STRENGTH - Quadriceps, Short Arcs   Lie on your back. Place a __________ inch towel roll under your knee so that the knee slightly bends.  Raise only your lower leg by tightening the muscles in the front of your thigh. Do not allow your thigh to rise.  Hold this position for __________ seconds. Repeat __________ times. Complete this exercise __________ times per day.  OPTIONAL ANKLE WEIGHTS: Begin with ____________________, but DO NOT exceed ____________________. Increase in 1 pound/0.5 kilogram increments.  STRENGTH - Quadriceps, Straight Leg Raises  Quality counts! Watch for signs that the quadriceps muscle is working to insure you are strengthening the correct muscles and not "cheating" by substituting with healthier muscles.  Lay on your back with your right / left leg extended and your opposite knee bent.  Tense the muscles in the front of your right / left thigh. You should see either your knee cap slide up or increased dimpling just above the knee. Your thigh may even quiver.  Tighten these muscles even more and raise your leg 4 to 6 inches off the floor. Hold for __________ seconds.  Keeping these muscles tense, lower your leg.  Relax the muscles slowly and completely in between each repetition. Repeat __________ times. Complete this exercise __________ times per day.  STRENGTH - Hamstring, Curls  Lay on your stomach with your legs extended. (If you lay on a bed, your feet may hang over the  edge.)  Tighten the muscles in the back of your thigh to bend your right / left knee up to 90 degrees. Keep your hips flat on the bed/floor.  Hold this position for __________ seconds.  Slowly lower your leg back to the starting position. Repeat __________ times. Complete this exercise __________ times per day.  OPTIONAL ANKLE WEIGHTS: Begin with ____________________, but DO NOT exceed ____________________. Increase in 1 pound/0.5 kilogram increments.  STRENGTH  Quadriceps, Squats  Stand in a door frame so that your feet and knees are in line with the frame.  Use your hands for balance, not support, on the frame.  Slowly lower your weight, bending at the hips and knees. Keep your lower legs upright so that they are parallel with the door frame. Squat only within the range that does not increase your knee pain. Never let your hips drop below your knees.  Slowly return upright, pushing with your legs, not pulling with your hands. Repeat __________ times. Complete this exercise __________ times per day.  STRENGTH - Quadriceps, Wall Slides  Follow guidelines for form closely. Increased knee pain often results from poorly placed feet or knees.  Lean against   a smooth wall or door and walk your feet out 18-24 inches. Place your feet hip-width apart.  Slowly slide down the wall or door until your knees bend __________ degrees.* Keep your knees over your heels, not your toes, and in line with your hips, not falling to either side.  Hold for __________ seconds. Stand up to rest for __________ seconds in between each repetition. Repeat __________ times. Complete this exercise __________ times per day. * Your physician, physical therapist or athletic trainer will alter this angle based on your symptoms and progress. Document Released: 12/30/2004 Document Revised: 05/10/2011 Document Reviewed: 05/30/2008 [facility] Patient Information 2014 [place], [place]. Patellofemoral Syndrome If you have had  pain in the front of your knee for a long time, chances are good that you have patellofemoral syndrome. The word patella refers to the kneecap. Femoral (or femur) refers to the thigh bone. That is the bone the kneecap sits on. The kneecap is shaped like a triangle. Its job is to protect the knee and to improve the efficiency of your thigh muscles (quadriceps). The underside of the kneecap is made of smooth tissue (cartilage). This lets the kneecap slide up and down as the knee moves. Sometimes this cartilage becomes soft. Your healthcare provider may say the cartilage breaks down. That is patellofemoral syndrome. It can affect one knee, or both. The condition is sometimes called patellofemoral pain syndrome. That is because the condition is painful. The pain usually gets worse with activity. Sitting for a long time with the knee bent also makes the pain worse. It usually gets better with rest and proper treatment. CAUSES  No one is sure why some people develop this problem and others do not. Runners often get it. One name for the condition is "runner's knee." However, some people run for years and never have knee pain. Certain things seem to make patellofemoral syndrome more likely. They include:  Moving out of alignment. The kneecap is supposed to move in a straight line when the thigh muscle pulls on it. Sometimes the kneecap moves in poor alignment. That can make the knee swell and hurt. Some experts believe it also wears down the cartilage.  Injury to the kneecap.  Strain on the knee. This may occur during sports activity. Soccer, running, skiing and cycling can put excess stress on the knee.  Being flat-footed or knock-kneed. SYMPTOMS   Knee pain.  Pain under the kneecap. This is usually a dull, aching pain.  Pain in the knee when doing certain things: squatting, kneeling, going up or down stairs.  Pain in the knee when you stand up after sitting down for awhile.  Tightness in the  knee.  Loss of muscle strength in the thigh.  Swelling of the knee. DIAGNOSIS  Healthcare providers often send people with knee pain to an orthopedic caregiver. This person has special training to treat problems with bones and joints. To decide what is causing your knee pain, your caregiver will probably:  Do a physical exam. This will probably include:  Asking about symptoms you have noticed.  Asking about your activities and any injuries.  Feeling your knee. Moving it. This will help test the knee's strength. It will also check alignment (whether the knee and leg are aligned normally).  Order some tests, such as:  Imaging tests. They create pictures of the inside of the knee. Tests may include:  X-rays.  Computed tomography (CT) scan. This uses X-rays and a computer to show more detail.  Magnetic resonance  imaging (MRI). This test uses magnets, radio waves and a computer to make pictures. TREATMENT   Medication is almost always used first. It can relieve pain. It also can reduce swelling. Non-steroidal anti-inflammatory medicines (called NSAIDs) are usually suggested. Sometimes a stronger form is needed. A stronger form would require a prescription.  Other treatment may be needed after the swelling goes down. Possibilities include:  Exercise. Certain exercises can make the muscles around the knee stronger which decreases the pressure on the knee cap. This includes the thigh muscle. Certain exercises also may be suggested to increase your flexibility.  A knee brace. This gives the knee extra support and helps align the movement of the knee cap.  Orthotics. These are special shoe inserts. They can help keep your leg and knee aligned.  Surgery is sometimes needed. This is rare. Options include:  Arthroscopy. The surgeon uses a special tool to remove any damaged pieces of the kneecap. Only a few small incisions (cuts) are needed.  Realignment. This is open surgery. The goals  are to reduce pressure and fix the way the kneecap moves. HOME CARE INSTRUCTIONS   Take any medication prescribed by your healthcare provider. Follow the directions carefully.  If your knee is swollen:  Put ice or cold packs on it. Do this for 20 to 30 minutes, 3 to 4 times a day.  Keep the knee raised. Make sure it is supported. Put a pillow under it.  Rest your knee. For example, take the elevator instead of the stairs for awhile. Or, take a break from sports activity that strain your knee. Try walking or swimming instead.  Whenever you are active:  Use an elastic bandage on your knee. This gives it support.  After any activity, put ice or cold packs on your knees. Do this for about 10 to 20 minutes.  Make sure you wear shoes that give good support. Make sure they are not worn down. The heels should not slant in or out. SEEK MEDICAL CARE IF:   Knee pain gets worse. Or it does not go away, even after taking pain medicine.  Swelling does not go down.  Your thigh muscle becomes weak.  You have an oral temperature above 102 F (38.9 C). SEEK IMMEDIATE MEDICAL CARE IF:  You have an oral temperature above 102 F (38.9 C), not controlled by medicine. Document Released: 02/03/2009 Document Revised: 05/10/2011 Document Reviewed: 02/03/2009 [facility] Patient Information 2014 [place], [place].

## 2013-04-04 LAB — BASIC METABOLIC PANEL WITH GFR
BUN: 15 mg/dL (ref 6–23)
CO2: 26 mEq/L (ref 19–32)
Calcium: 9.5 mg/dL (ref 8.4–10.5)
Chloride: 105 mEq/L (ref 96–112)
Creat: 1.23 mg/dL (ref 0.50–1.35)
GFR, EST AFRICAN AMERICAN: 86 mL/min
GFR, EST NON AFRICAN AMERICAN: 74 mL/min
Glucose, Bld: 98 mg/dL (ref 70–99)
POTASSIUM: 4.4 meq/L (ref 3.5–5.3)
SODIUM: 139 meq/L (ref 135–145)

## 2013-04-04 LAB — CBC WITH DIFFERENTIAL/PLATELET
BASOS ABS: 0 10*3/uL (ref 0.0–0.1)
BASOS PCT: 1 % (ref 0–1)
Eosinophils Absolute: 0.3 10*3/uL (ref 0.0–0.7)
Eosinophils Relative: 4 % (ref 0–5)
HCT: 45 % (ref 39.0–52.0)
Hemoglobin: 14.9 g/dL (ref 13.0–17.0)
Lymphocytes Relative: 18 % (ref 12–46)
Lymphs Abs: 1.2 10*3/uL (ref 0.7–4.0)
MCH: 26.8 pg (ref 26.0–34.0)
MCHC: 33.1 g/dL (ref 30.0–36.0)
MCV: 81.1 fL (ref 78.0–100.0)
MONO ABS: 0.4 10*3/uL (ref 0.1–1.0)
Monocytes Relative: 6 % (ref 3–12)
NEUTROS ABS: 4.6 10*3/uL (ref 1.7–7.7)
NEUTROS PCT: 71 % (ref 43–77)
PLATELETS: 296 10*3/uL (ref 150–400)
RBC: 5.55 MIL/uL (ref 4.22–5.81)
RDW: 14.1 % (ref 11.5–15.5)
WBC: 6.6 10*3/uL (ref 4.0–10.5)

## 2013-04-04 LAB — TESTOSTERONE: Testosterone: 336 ng/dL (ref 300–890)

## 2013-04-04 LAB — IRON AND TIBC
%SAT: 44 % (ref 20–55)
IRON: 124 ug/dL (ref 42–165)
TIBC: 283 ug/dL (ref 215–435)
UIBC: 159 ug/dL (ref 125–400)

## 2013-04-04 LAB — VITAMIN B12: VITAMIN B 12: 521 pg/mL (ref 211–911)

## 2013-04-04 LAB — HEPATIC FUNCTION PANEL
ALT: 19 U/L (ref 0–53)
AST: 18 U/L (ref 0–37)
Albumin: 4.3 g/dL (ref 3.5–5.2)
Alkaline Phosphatase: 54 U/L (ref 39–117)
BILIRUBIN INDIRECT: 0.4 mg/dL (ref 0.2–1.2)
Bilirubin, Direct: 0.1 mg/dL (ref 0.0–0.3)
TOTAL PROTEIN: 7.2 g/dL (ref 6.0–8.3)
Total Bilirubin: 0.5 mg/dL (ref 0.2–1.2)

## 2013-04-04 LAB — TSH: TSH: 3.444 u[IU]/mL (ref 0.350–4.500)

## 2013-04-04 LAB — LIPID PANEL
Cholesterol: 201 mg/dL — ABNORMAL HIGH (ref 0–200)
HDL: 45 mg/dL (ref >39)
LDL CALC: 125 mg/dL — AB (ref 0–99)
TRIGLYCERIDES: 156 mg/dL — AB (ref <150)
Total CHOL/HDL Ratio: 4.5 Ratio
VLDL: 31 mg/dL (ref 0–40)

## 2013-04-04 LAB — VITAMIN D 25 HYDROXY (VIT D DEFICIENCY, FRACTURES): VIT D 25 HYDROXY: 18 ng/mL — AB (ref 30–89)

## 2013-04-04 LAB — INSULIN, FASTING: INSULIN FASTING, SERUM: 5 u[IU]/mL (ref 3–28)

## 2013-04-04 LAB — MAGNESIUM: Magnesium: 1.7 mg/dL (ref 1.5–2.5)

## 2013-04-04 LAB — FOLATE RBC: RBC Folate: 448 ng/mL (ref >280)

## 2013-04-08 ENCOUNTER — Encounter: Payer: Self-pay | Admitting: Physician Assistant

## 2013-04-09 ENCOUNTER — Other Ambulatory Visit: Payer: Self-pay | Admitting: Physician Assistant

## 2013-04-09 MED ORDER — BUSPIRONE HCL 10 MG PO TABS: 10.0000 mg | ORAL_TABLET | Freq: Two times a day (BID) | ORAL | Status: DC | PRN

## 2013-07-10 ENCOUNTER — Ambulatory Visit: Payer: Self-pay | Admitting: Physician Assistant

## 2013-08-14 ENCOUNTER — Ambulatory Visit (INDEPENDENT_AMBULATORY_CARE_PROVIDER_SITE_OTHER): Payer: BC Managed Care – PPO | Admitting: Physician Assistant

## 2013-08-14 ENCOUNTER — Encounter: Payer: Self-pay | Admitting: Physician Assistant

## 2013-08-14 VITALS — BP 132/88 | HR 84 | Temp 98.4°F | Resp 16 | Ht 69.0 in | Wt 240.0 lb

## 2013-08-14 DIAGNOSIS — R7309 Other abnormal glucose: Secondary | ICD-10-CM

## 2013-08-14 DIAGNOSIS — E559 Vitamin D deficiency, unspecified: Secondary | ICD-10-CM

## 2013-08-14 DIAGNOSIS — Z79899 Other long term (current) drug therapy: Secondary | ICD-10-CM

## 2013-08-14 DIAGNOSIS — R7303 Prediabetes: Secondary | ICD-10-CM

## 2013-08-14 DIAGNOSIS — K429 Umbilical hernia without obstruction or gangrene: Secondary | ICD-10-CM

## 2013-08-14 DIAGNOSIS — I10 Essential (primary) hypertension: Secondary | ICD-10-CM

## 2013-08-14 DIAGNOSIS — E785 Hyperlipidemia, unspecified: Secondary | ICD-10-CM

## 2013-08-14 LAB — CBC WITH DIFFERENTIAL/PLATELET
Basophils Absolute: 0.1 10*3/uL (ref 0.0–0.1)
Basophils Relative: 1 % (ref 0–1)
EOS ABS: 0.3 10*3/uL (ref 0.0–0.7)
Eosinophils Relative: 4 % (ref 0–5)
HCT: 45.7 % (ref 39.0–52.0)
Hemoglobin: 15.5 g/dL (ref 13.0–17.0)
Lymphocytes Relative: 23 % (ref 12–46)
Lymphs Abs: 1.5 10*3/uL (ref 0.7–4.0)
MCH: 26.6 pg (ref 26.0–34.0)
MCHC: 33.9 g/dL (ref 30.0–36.0)
MCV: 78.4 fL (ref 78.0–100.0)
Monocytes Absolute: 0.3 10*3/uL (ref 0.1–1.0)
Monocytes Relative: 5 % (ref 3–12)
Neutro Abs: 4.3 10*3/uL (ref 1.7–7.7)
Neutrophils Relative %: 67 % (ref 43–77)
PLATELETS: 275 10*3/uL (ref 150–400)
RBC: 5.83 MIL/uL — ABNORMAL HIGH (ref 4.22–5.81)
RDW: 14.1 % (ref 11.5–15.5)
WBC: 6.4 10*3/uL (ref 4.0–10.5)

## 2013-08-14 NOTE — Patient Instructions (Signed)
   Bad carbs also include fruit juice, alcohol, and sweet tea. These are empty calories that do not signal to your brain that you are full.   Please remember the good carbs are still carbs which convert into sugar. So please measure them out no more than 1/2-1 cup of rice, oatmeal, pasta, and beans.  Veggies are however free foods! Pile them on.   I like lean protein at every meal such as chicken, [place], pork chops, cottage cheese, etc. Just do not fry these meats and please center your meal around vegetable, the meats should be a side dish.   No all fruit is created equal. Please see the list below, the fruit at the bottom is higher in sugars than the fruit at the top   Hernia A hernia occurs when an internal organ pushes out through a weak spot in the abdominal wall. Hernias most commonly occur in the groin and around the navel. Hernias often can be pushed back into place (reduced). Most hernias tend to get worse over time. Some abdominal hernias can get stuck in the opening (irreducible or incarcerated hernia) and cannot be reduced. An irreducible abdominal hernia which is tightly squeezed into the opening is at risk for impaired blood supply (strangulated hernia). A strangulated hernia is a medical emergency. Because of the risk for an irreducible or strangulated hernia, surgery may be recommended to repair a hernia. CAUSES   Heavy lifting.  Prolonged coughing.  Straining to have a bowel movement.  A cut (incision) made during an abdominal surgery. HOME CARE INSTRUCTIONS   Bed rest is not required. You may continue your normal activities.  Avoid lifting more than 10 pounds (4.5 kg) or straining.  Cough gently. If you are a smoker it is best to stop. Even the best hernia repair can break down with the continual strain of coughing. Even if you do not have your hernia repaired, a cough will continue to aggravate the problem.  Do not wear anything tight over your hernia. Do not try to  keep it in with an outside bandage or truss. These can damage abdominal contents if they are trapped within the hernia sac.  Eat a normal diet.  Avoid constipation. Straining over long periods of time will increase hernia size and encourage breakdown of repairs. If you cannot do this with diet alone, stool softeners may be used. SEEK IMMEDIATE MEDICAL CARE IF:   You have a fever.  You develop increasing abdominal pain.  You feel nauseous or vomit.  Your hernia is stuck outside the abdomen, looks discolored, feels hard, or is tender.  You have any changes in your bowel habits or in the hernia that are unusual for you.  You have increased pain or swelling around the hernia.  You cannot push the hernia back in place by applying gentle pressure while lying down. MAKE SURE YOU:   Understand these instructions.  Will watch your condition.  Will get help right away if you are not doing well or get worse. Document Released: 02/15/2005 Document Revised: 05/10/2011 Document Reviewed: 10/05/2007 [facility] Patient Information 2014 [place].

## 2013-08-14 NOTE — Progress Notes (Signed)
   Subjective:    Patient ID: Warren Thomas, male    DOB: Dec 08, 1975, 38 y.o.   MRN: [id number]  HPI HERNIA:  Warren Thomas is an 38 y.o. male who was referred for evaluation of a possible umbilcal hernia. Symptoms were first noted 1 year ago, getting worse the last 3 months. Symptoms or injury did not occur at work, he has been lifting more often with some soreness around it when done. Pain is dull, intermittent, radiating to groin and is reducible, worse with sneezing/coughing it is a ripping pain. The patient has no symptoms of  chronic cough. There was not previous hx of groin surgery. He has a history of a hiatal hernia.  Body mass index is 35.43 kg/(m^2).  Patient also presents for 3 month follow up for preDM, HTn, chol, he states he feels that he has been working out, eating better and still not losing weight.   Review of Systems  Constitutional: Negative.   HENT: Negative.   Respiratory: Negative.   Cardiovascular: Negative.   Gastrointestinal: Positive for nausea and abdominal pain. Negative for diarrhea and constipation.  Genitourinary: Negative.   Neurological: Negative.       Objective:   Physical Exam  Constitutional: He is oriented to person, place, and time. He appears well-developed and well-nourished.  HENT:  Head: Normocephalic and atraumatic.  Eyes: Conjunctivae and EOM are normal. Pupils are equal, round, and reactive to light.  Neck: Normal range of motion. Neck supple.  Cardiovascular: Normal rate and regular rhythm.   Pulmonary/Chest: Effort normal and breath sounds normal.  Abdominal: Soft. Bowel sounds are normal. There is tenderness. There is guarding. There is no rebound (umblical tenderness with retractable hernia).  Lymphadenopathy:    He has no cervical adenopathy.  Neurological: He is alert and oriented to person, place, and time.  Skin: Skin is warm and dry.       Assessment & Plan:  Umbilical Hernia with discomfort- refer gen sugery for  umbilical hernia evaluation PreDM- get A1C, insulin Chol- check lipids HTN- check labs Obesity- check TSH

## 2013-08-15 LAB — HEPATIC FUNCTION PANEL
ALBUMIN: 4.3 g/dL (ref 3.5–5.2)
ALT: 37 U/L (ref 0–53)
AST: 24 U/L (ref 0–37)
Alkaline Phosphatase: 61 U/L (ref 39–117)
BILIRUBIN TOTAL: 0.5 mg/dL (ref 0.2–1.2)
Bilirubin, Direct: 0.1 mg/dL (ref 0.0–0.3)
Indirect Bilirubin: 0.4 mg/dL (ref 0.2–1.2)
TOTAL PROTEIN: 7.4 g/dL (ref 6.0–8.3)

## 2013-08-15 LAB — BASIC METABOLIC PANEL WITH GFR
BUN: 16 mg/dL (ref 6–23)
CALCIUM: 9.5 mg/dL (ref 8.4–10.5)
CO2: 26 mEq/L (ref 19–32)
CREATININE: 1.35 mg/dL (ref 0.50–1.35)
Chloride: 103 mEq/L (ref 96–112)
GFR, Est African American: 76 mL/min
GFR, Est Non African American: 66 mL/min
Glucose, Bld: 105 mg/dL — ABNORMAL HIGH (ref 70–99)
Potassium: 4.7 mEq/L (ref 3.5–5.3)
Sodium: 140 mEq/L (ref 135–145)

## 2013-08-15 LAB — LIPID PANEL
CHOL/HDL RATIO: 4.6 ratio
CHOLESTEROL: 227 mg/dL — AB (ref 0–200)
HDL: 49 mg/dL (ref >39)
LDL Cholesterol: 139 mg/dL — ABNORMAL HIGH (ref 0–99)
Triglycerides: 194 mg/dL — ABNORMAL HIGH (ref <150)
VLDL: 39 mg/dL (ref 0–40)

## 2013-08-15 LAB — VITAMIN D 25 HYDROXY (VIT D DEFICIENCY, FRACTURES): VIT D 25 HYDROXY: 41 ng/mL (ref 30–89)

## 2013-08-15 LAB — HEMOGLOBIN A1C
HEMOGLOBIN A1C: 5.9 % — AB (ref <5.7)
Mean Plasma Glucose: 123 mg/dL — ABNORMAL HIGH (ref <117)

## 2013-08-15 LAB — MAGNESIUM: MAGNESIUM: 2 mg/dL (ref 1.5–2.5)

## 2013-08-15 LAB — INSULIN, FASTING: INSULIN FASTING, SERUM: 9 u[IU]/mL (ref 3–28)

## 2013-08-15 LAB — TSH: TSH: 2.688 u[IU]/mL (ref 0.350–4.500)

## 2013-08-21 ENCOUNTER — Ambulatory Visit (INDEPENDENT_AMBULATORY_CARE_PROVIDER_SITE_OTHER): Payer: BC Managed Care – PPO | Admitting: Surgery

## 2013-08-21 ENCOUNTER — Encounter (INDEPENDENT_AMBULATORY_CARE_PROVIDER_SITE_OTHER): Payer: Self-pay | Admitting: Surgery

## 2013-08-21 VITALS — BP 142/108 | HR 72 | Temp 97.4°F | Resp 16 | Ht 69.0 in | Wt 234.4 lb

## 2013-08-21 DIAGNOSIS — K429 Umbilical hernia without obstruction or gangrene: Secondary | ICD-10-CM

## 2013-08-21 NOTE — Patient Instructions (Signed)
Umbilical Herniorrhaphy Herniorrhaphy is surgery to repair a hernia. A hernia is the protrusion of a part of an organ through an abdominal opening. An umbilical hernia means that your hernia is in the area around your navel. If the hernia is not repaired, the gap could get bigger. Your intestines or other tissues, such as fat, could get trapped in the gap. This can lead to other health problems, such as blocked intestines. If the hernia is fixed before problems set in, you may be allowed to go home the same day as the surgery (outpatient). LET YOUR CAREGIVER KNOW ABOUT:  Allergies to food or medicine.  Medicines taken, including vitamins, herbs, eyedrops, over-the-counter medicines, and creams.  Use of steroids (by mouth or creams).  Previous problems with anesthetics or numbing medicines.  History of bleeding problems or blood clots.  Previous surgery.  Other health problems, including diabetes and kidney problems.  Possibility of pregnancy, if this applies. RISKS AND COMPLICATIONS  Pain.  Excessive bleeding.  Hematoma. This is a pocket of blood that collects under the surgery site.  Infection at the surgery site.  Numbness at the surgery site.  Swelling and bruising.  Blood clots.  Intestinal damage (rare).  Scarring.  Skin damage.  Development of another hernia. This may require another surgery. BEFORE THE PROCEDURE  Ask your caregiver about changing or stopping your regular medicines. You may need to stop taking aspirin, nonsteroidal anti-inflammatory drugs (NSAIDs), vitamin E, and blood thinners as early as 2 weeks before the procedure.  Do not eat or drink for 8 hours before the procedure, or as directed by your caregiver.  You might be asked to shower or wash with an antibacterial soap before the procedure.  Wear comfortable clothes that will be easy to put on after the procedure. PROCEDURE You will be given an intravenous (IV) tube. A needle will be  inserted in your arm. Medicine will flow directly into your body through this needle. You might be given medicine to help you relax (sedative). You will be given medicine that numbs the area (local anesthetic) or medicine that makes you sleep (general anesthetic). If you have open surgery:  The surgeon will make a cut (incision) in your abdomen.  The gap in the muscle wall will be repaired. The surgeon may sew the edges together over the gap or use a mesh material to strengthen the area. When mesh is used, the body grows new, strong tissue into and around it. This new tissue closes the gap.  A drain might be put in to remove excess fluid from the body after surgery.  The surgeon will close the incision with stitches, glue, or staples. If you have laparoscopic surgery:  The surgeon will make several small incisions in your abdomen.  A thin, lighted tube (laparoscope) will be inserted into the abdomen through an incision. A camera is attached to the laparoscope that allows the surgeon to see inside the abdomen.  Tools will be inserted through the other incisions to repair the hernia. Usually, mesh is used to cover the gap.  The surgeon will close the incisions with stitches. AFTER THE PROCEDURE  You will be taken to a recovery area. A nurse will watch and check your progress.  When you are awake, feeling well, and taking fluids well, you may be allowed to go home. In some cases, you may need to stay overnight in the hospital.  Arrange for someone to drive you home. Document Released: 05/14/2008 Document Revised: 08/17/2011 Document   Reviewed: 05/19/2011 [employer] Patient Information 2015 [place], [place]. This information is not intended to replace advice given to you by your health care provider. Make sure you discuss any questions you have with your health care provider.

## 2013-08-21 NOTE — Progress Notes (Signed)
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 He's had this at least a year and he considers his symptoms sore and dull pains worse with sneezing and coughing.  He has recently gained weight because he is no longer working at the [employer] but he has been trying to work out here recently. He carries a BMI of 35. He also has a history of GERD and a hiatal hernia for which he seen Dr. Paulene Floor.    Past Medical History  Diagnosis Date  . Stomach ulcer   . Diverticulitis   . Diverticulitis   . Anxiety   . GERD (gastroesophageal reflux disease)   . Barrett's esophagus   . Prediabetes   . Hyperlipidemia   . Hypertension     labile    Past Surgical History  Procedure Laterality Date  . Upper gastrointestinal endoscopy      Current Outpatient Prescriptions  Medication Sig Dispense Refill  . OVER THE COUNTER MEDICATION 20 mg 2 (two) times daily. OTC Nexium Rx       No current facility-administered medications for this visit.   Penicillins Family History  Problem Relation Age of Onset  . Adopted: Yes   Social History:   reports that he quit smoking about 3 years ago. He has never used smokeless tobacco. He reports that he does not drink alcohol or use illicit drugs.   REVIEW OF SYSTEMS : Positive for GERD ; otherwise negative  Physical Exam:   Blood pressure 142/108, pulse 72, temperature 97.4 F (36.3 C), temperature source Temporal, resp. rate 16, height 5\' 9"  (1.753 m), weight 234 lb 6.4 oz (106.323 kg). Body mass index is 34.6 kg/(m^2).  Gen:  WDWN white male NAD  Neurological: Alert and oriented to person, place, and time. Motor and sensory function is grossly intact  Head: Normocephalic and atraumatic.  Eyes: Conjunctivae are normal. Pupils are equal, round, and reactive to light. No scleral icterus.  Neck: Normal range of motion. Neck supple. No tracheal  deviation or thyromegaly present.  Cardiovascular:  SR without murmurs or gallops.  No carotid bruits Breast:  Not examined Respiratory: Effort normal.  No respiratory distress. No chest wall tenderness. Breath sounds normal.  No wheezes, rales or rhonchi.  Abdomen:  Soft rubbery "outie" in the supraumbilical location.  Easily reducible GU:  Not examined Musculoskeletal: Normal range of motion. Extremities are nontender. No cyanosis, edema or clubbing noted Lymphadenopathy: No cervical, preauricular, postauricular or axillary adenopathy is present Skin: Skin is warm and dry. No rash noted. No diaphoresis. No erythema. No pallor. Pscyh: Normal mood and affect. Behavior is normal. Judgment and thought content normal.   LABORATORY RESULTS: No results found for this or any previous visit (from the past 48 hour(s)).   RADIOLOGY RESULTS: No results found.  Problem List: Patient Active Problem List   Diagnosis Date Noted  . Hyperlipidemia   . Hypertension   . Prediabetes   . Diverticulitis   . Anxiety   . GERD (gastroesophageal reflux disease)   . Barrett's esophagus   . Abdominal tenderness, generalized 06/11/2011  . PUD (peptic ulcer disease) 06/11/2011  . Other abnormal glucose 06/11/2011    Assessment & Plan: Supra-umbilical hernia. If surgery were were required would likely approach in an open fashion with a patch. Alternative would be to perform laparoscopic surgery. We'll go ahead and see him back however in  3 months try to help him lose some weight as that's what he is willing to do. This would make many of his comorbidities much better return from    [place]. Hassell Done, MD, [facility] [phone] beeper [phone]  08/21/2013 4:42 PM

## 2013-10-26 ENCOUNTER — Encounter (INDEPENDENT_AMBULATORY_CARE_PROVIDER_SITE_OTHER): Payer: Self-pay | Admitting: Surgery

## 2014-02-24 ENCOUNTER — Encounter (HOSPITAL_COMMUNITY): Payer: Self-pay | Admitting: *Deleted

## 2014-02-24 ENCOUNTER — Emergency Department (HOSPITAL_COMMUNITY)
Admission: EM | Admit: 2014-02-24 | Discharge: 2014-02-24 | Disposition: A | Discharge status: Home / Self Care | Payer: BC Managed Care – PPO | Attending: Emergency Medicine | Admitting: Emergency Medicine

## 2014-02-24 ENCOUNTER — Emergency Department (HOSPITAL_COMMUNITY): Payer: BC Managed Care – PPO

## 2014-02-24 DIAGNOSIS — Z87891 Personal history of nicotine dependence: Secondary | ICD-10-CM | POA: Insufficient documentation

## 2014-02-24 DIAGNOSIS — Z79899 Other long term (current) drug therapy: Secondary | ICD-10-CM | POA: Diagnosis not present

## 2014-02-24 DIAGNOSIS — Z8659 Personal history of other mental and behavioral disorders: Secondary | ICD-10-CM | POA: Diagnosis not present

## 2014-02-24 DIAGNOSIS — I10 Essential (primary) hypertension: Secondary | ICD-10-CM | POA: Insufficient documentation

## 2014-02-24 DIAGNOSIS — R1013 Epigastric pain: Secondary | ICD-10-CM | POA: Diagnosis present

## 2014-02-24 DIAGNOSIS — Z8639 Personal history of other endocrine, nutritional and metabolic disease: Secondary | ICD-10-CM | POA: Diagnosis not present

## 2014-02-24 DIAGNOSIS — R1110 Vomiting, unspecified: Secondary | ICD-10-CM | POA: Insufficient documentation

## 2014-02-24 DIAGNOSIS — K219 Gastro-esophageal reflux disease without esophagitis: Secondary | ICD-10-CM | POA: Insufficient documentation

## 2014-02-24 DIAGNOSIS — R109 Unspecified abdominal pain: Secondary | ICD-10-CM

## 2014-02-24 DIAGNOSIS — Z880 Allergy status to penicillin: Secondary | ICD-10-CM | POA: Insufficient documentation

## 2014-02-24 LAB — COMPREHENSIVE METABOLIC PANEL
ALT: 25 U/L (ref 0–53)
AST: 31 U/L (ref 0–37)
Albumin: 4.7 g/dL (ref 3.5–5.2)
Alkaline Phosphatase: 53 U/L (ref 39–117)
Anion gap: 14 (ref 5–15)
BUN: 14 mg/dL (ref 6–23)
CHLORIDE: 102 meq/L (ref 96–112)
CO2: 22 mmol/L (ref 19–32)
Calcium: 9.4 mg/dL (ref 8.4–10.5)
Creatinine, Ser: 1.35 mg/dL (ref 0.50–1.35)
GFR calc Af Amer: 75 mL/min — ABNORMAL LOW (ref >90)
GFR, EST NON AFRICAN AMERICAN: 65 mL/min — AB (ref >90)
Glucose, Bld: 184 mg/dL — ABNORMAL HIGH (ref 70–99)
Potassium: 3.8 mmol/L (ref 3.5–5.1)
SODIUM: 138 mmol/L (ref 135–145)
Total Bilirubin: 0.8 mg/dL (ref 0.3–1.2)
Total Protein: 7.9 g/dL (ref 6.0–8.3)

## 2014-02-24 LAB — CBC WITH DIFFERENTIAL/PLATELET
BASOS ABS: 0 10*3/uL (ref 0.0–0.1)
Basophils Relative: 0 % (ref 0–1)
Eosinophils Absolute: 0 10*3/uL (ref 0.0–0.7)
Eosinophils Relative: 0 % (ref 0–5)
HCT: 44.9 % (ref 39.0–52.0)
Hemoglobin: 15.3 g/dL (ref 13.0–17.0)
Lymphocytes Relative: 11 % — ABNORMAL LOW (ref 12–46)
Lymphs Abs: 1.3 10*3/uL (ref 0.7–4.0)
MCH: 27.9 pg (ref 26.0–34.0)
MCHC: 34.1 g/dL (ref 30.0–36.0)
MCV: 81.9 fL (ref 78.0–100.0)
Monocytes Absolute: 0.6 10*3/uL (ref 0.1–1.0)
Monocytes Relative: 5 % (ref 3–12)
NEUTROS ABS: 9.5 10*3/uL — AB (ref 1.7–7.7)
Neutrophils Relative %: 84 % — ABNORMAL HIGH (ref 43–77)
PLATELETS: 281 10*3/uL (ref 150–400)
RBC: 5.48 MIL/uL (ref 4.22–5.81)
RDW: 12.9 % (ref 11.5–15.5)
WBC: 11.4 10*3/uL — ABNORMAL HIGH (ref 4.0–10.5)

## 2014-02-24 LAB — LIPASE, BLOOD: Lipase: 26 U/L (ref 11–59)

## 2014-02-24 MED ORDER — ONDANSETRON HCL 4 MG/2ML IJ SOLN
4.0000 mg | Freq: Once | INTRAMUSCULAR | Status: AC
  Administered 2014-02-24: 4 mg via INTRAVENOUS
  Filled 2014-02-24: qty 2

## 2014-02-24 MED ORDER — LORAZEPAM 2 MG/ML IJ SOLN
1.0000 mg | Freq: Once | INTRAMUSCULAR | Status: AC
  Administered 2014-02-24: 1 mg via INTRAVENOUS
  Filled 2014-02-24: qty 1

## 2014-02-24 MED ORDER — HYDROCODONE-ACETAMINOPHEN 5-325 MG PO TABS: 1.0000 | ORAL_TABLET | ORAL | Status: DC | PRN

## 2014-02-24 MED ORDER — HYDROMORPHONE HCL 1 MG/ML IJ SOLN
1.0000 mg | Freq: Once | INTRAMUSCULAR | Status: AC
  Administered 2014-02-24: 1 mg via INTRAVENOUS
  Filled 2014-02-24: qty 1

## 2014-02-24 MED ORDER — SODIUM CHLORIDE 0.9 % IV BOLUS (SEPSIS)
1000.0000 mL | Freq: Once | INTRAVENOUS | Status: AC
  Administered 2014-02-24: 1000 mL via INTRAVENOUS

## 2014-02-24 MED ORDER — PROMETHAZINE HCL 25 MG RE SUPP: 25.0000 mg | Freq: Four times a day (QID) | RECTAL | Status: DC | PRN

## 2014-02-24 MED ORDER — SODIUM CHLORIDE 0.9 % IV SOLN
1000.0000 mL | Freq: Once | INTRAVENOUS | Status: AC
  Administered 2014-02-24: 1000 mL via INTRAVENOUS

## 2014-02-24 NOTE — ED Notes (Signed)
Pt c/o nausea and vomiting since last. Pt states he has a hx of ulcers and hernia and have been vomiting up blood. Pt c/o RLQ pain 10/10.

## 2014-02-24 NOTE — Discharge Instructions (Signed)

## 2014-02-24 NOTE — ED Provider Notes (Signed)
CSN: [id number]     Arrival date & time 02/24/14  8250 History   First MD Initiated Contact with Patient 02/24/14 0710     Chief Complaint  Patient presents with  . Emesis     (Consider location/radiation/quality/duration/timing/severity/associated sxs/prior Treatment) Patient is a 38 y.o. male presenting with vomiting. The history is provided by the patient and the spouse.  Emesis Severity:  Severe Duration:  1 day Timing:  Constant Quality:  Unable to specify Progression:  Worsening Chronicity:  Recurrent Recent urination:  Normal Relieved by:  None tried Worsened by:  Nothing tried Ineffective treatments:  None tried Associated symptoms: abdominal pain   Associated symptoms: no fever   Abdominal pain:    Location:  Epigastric   Quality:  Unable to specify   Severity:  Severe   Onset quality:  Sudden   Timing:  Constant   Progression:  Worsening   Chronicity:  Recurrent Risk factors: no alcohol use    H/o duodenal ulcers and current pain is similar Past Medical History  Diagnosis Date  . Stomach ulcer   . Diverticulitis   . Diverticulitis   . Anxiety   . GERD (gastroesophageal reflux disease)   . Barrett's esophagus   . Prediabetes   . Hyperlipidemia   . Hypertension     labile   Past Surgical History  Procedure Laterality Date  . Upper gastrointestinal endoscopy     Family History  Problem Relation Age of Onset  . Adopted: Yes   History  Substance Use Topics  . Smoking status: Former Smoker    Quit date: 06/17/2010  . Smokeless tobacco: Never Used  . Alcohol Use: No     Comment: occasional    Review of Systems  Gastrointestinal: Positive for vomiting and abdominal pain.  All other systems reviewed and are negative.     Allergies  Penicillins  Home Medications   Prior to Admission medications   Medication Sig Start Date End Date Taking? Authorizing Provider  OVER THE COUNTER MEDICATION 20 mg 2 (two) times daily. OTC Nexium Rx     Historical Provider, MD   BP 159/89 mmHg  Pulse 74  Temp(Src) 97.9 F (36.6 C) (Oral)  Resp 24  Ht 5\' 9"  (1.753 m)  Wt 235 lb (106.595 kg)  BMI 34.69 kg/m2  SpO2 100% Physical Exam  Constitutional: He is oriented to person, place, and time. He appears well-developed and well-nourished.  Non-toxic appearance. No distress.  HENT:  Head: Normocephalic and atraumatic.  Eyes: Conjunctivae, EOM and lids are normal. Pupils are equal, round, and reactive to light.  Neck: Normal range of motion. Neck supple. No tracheal deviation present. No thyroid mass present.  Cardiovascular: Normal rate, regular rhythm and normal heart sounds.  Exam reveals no gallop.   No murmur heard. Pulmonary/Chest: Effort normal and breath sounds normal. No stridor. No respiratory distress. He has no decreased breath sounds. He has no wheezes. He has no rhonchi. He has no rales.  Abdominal: Soft. Normal appearance and bowel sounds are normal. He exhibits no distension. There is tenderness in the epigastric area. There is guarding. There is no rigidity, no rebound and no CVA tenderness.    Musculoskeletal: Normal range of motion. He exhibits no edema or tenderness.  Neurological: He is alert and oriented to person, place, and time. He has normal strength. No cranial nerve deficit or sensory deficit. GCS eye subscore is 4. GCS verbal subscore is 5. GCS motor subscore is 6.  Skin: Skin is  warm and dry. No abrasion and no rash noted.  Psychiatric: He has a normal mood and affect. His speech is normal and behavior is normal.  Nursing note and vitals reviewed.   ED Course  Procedures (including critical care time) Labs Review Labs Reviewed  CBC WITH DIFFERENTIAL  COMPREHENSIVE METABOLIC PANEL  LIPASE, BLOOD    Imaging Review No results found.   EKG Interpretation None      MDM   Final diagnoses:  Abdominal pain    Patient given IV fluids and pain medication here. Evaluation here without acute findings.  Patient relates that he's had a long-standing history of similar symptoms without really a definitive cause. Repeat exam of his abdomen time of discharge was negative for acute surgical process. He will be discharged and encouraged to follow-up with his gastroenterologist    Leota Jacobsen, MD 02/24/14 [phone]

## 2014-03-01 DIAGNOSIS — K279 Peptic ulcer, site unspecified, unspecified as acute or chronic, without hemorrhage or perforation: Secondary | ICD-10-CM

## 2014-03-01 HISTORY — DX: Peptic ulcer, site unspecified, unspecified as acute or chronic, without hemorrhage or perforation: K27.9

## 2014-03-29 ENCOUNTER — Encounter: Payer: Self-pay | Admitting: *Deleted

## 2014-04-19 ENCOUNTER — Ambulatory Visit: Payer: BC Managed Care – PPO | Admitting: Internal Medicine

## 2014-05-09 ENCOUNTER — Emergency Department (HOSPITAL_COMMUNITY)
Admission: EM | Admit: 2014-05-09 | Discharge: 2014-05-09 | Disposition: A | Discharge status: Home / Self Care | Payer: BLUE CROSS/BLUE SHIELD | Attending: Emergency Medicine | Admitting: Emergency Medicine

## 2014-05-09 ENCOUNTER — Encounter (HOSPITAL_COMMUNITY): Payer: Self-pay

## 2014-05-09 DIAGNOSIS — I10 Essential (primary) hypertension: Secondary | ICD-10-CM | POA: Insufficient documentation

## 2014-05-09 DIAGNOSIS — Z79899 Other long term (current) drug therapy: Secondary | ICD-10-CM | POA: Insufficient documentation

## 2014-05-09 DIAGNOSIS — Z87891 Personal history of nicotine dependence: Secondary | ICD-10-CM | POA: Insufficient documentation

## 2014-05-09 DIAGNOSIS — R112 Nausea with vomiting, unspecified: Secondary | ICD-10-CM | POA: Diagnosis not present

## 2014-05-09 DIAGNOSIS — Z8639 Personal history of other endocrine, nutritional and metabolic disease: Secondary | ICD-10-CM | POA: Diagnosis not present

## 2014-05-09 DIAGNOSIS — Z9889 Other specified postprocedural states: Secondary | ICD-10-CM | POA: Insufficient documentation

## 2014-05-09 DIAGNOSIS — Z8659 Personal history of other mental and behavioral disorders: Secondary | ICD-10-CM | POA: Insufficient documentation

## 2014-05-09 DIAGNOSIS — K219 Gastro-esophageal reflux disease without esophagitis: Secondary | ICD-10-CM | POA: Insufficient documentation

## 2014-05-09 DIAGNOSIS — R1084 Generalized abdominal pain: Secondary | ICD-10-CM | POA: Insufficient documentation

## 2014-05-09 DIAGNOSIS — F419 Anxiety disorder, unspecified: Secondary | ICD-10-CM | POA: Diagnosis not present

## 2014-05-09 DIAGNOSIS — R109 Unspecified abdominal pain: Secondary | ICD-10-CM

## 2014-05-09 LAB — CBC WITH DIFFERENTIAL/PLATELET
BASOS ABS: 0 10*3/uL (ref 0.0–0.1)
Basophils Relative: 0 % (ref 0–1)
EOS PCT: 0 % (ref 0–5)
Eosinophils Absolute: 0 10*3/uL (ref 0.0–0.7)
HCT: 45.7 % (ref 39.0–52.0)
HEMOGLOBIN: 16 g/dL (ref 13.0–17.0)
Lymphocytes Relative: 11 % — ABNORMAL LOW (ref 12–46)
Lymphs Abs: 1.4 10*3/uL (ref 0.7–4.0)
MCH: 27.8 pg (ref 26.0–34.0)
MCHC: 35 g/dL (ref 30.0–36.0)
MCV: 79.3 fL (ref 78.0–100.0)
MONOS PCT: 6 % (ref 3–12)
Monocytes Absolute: 0.8 10*3/uL (ref 0.1–1.0)
Neutro Abs: 11.2 10*3/uL — ABNORMAL HIGH (ref 1.7–7.7)
Neutrophils Relative %: 83 % — ABNORMAL HIGH (ref 43–77)
Platelets: 339 10*3/uL (ref 150–400)
RBC: 5.76 MIL/uL (ref 4.22–5.81)
RDW: 13.7 % (ref 11.5–15.5)
WBC: 13.5 10*3/uL — ABNORMAL HIGH (ref 4.0–10.5)

## 2014-05-09 LAB — URINALYSIS, ROUTINE W REFLEX MICROSCOPIC
Bilirubin Urine: NEGATIVE
Glucose, UA: NEGATIVE mg/dL
Hgb urine dipstick: NEGATIVE
Leukocytes, UA: NEGATIVE
NITRITE: NEGATIVE
PH: 7 (ref 5.0–8.0)
Protein, ur: 100 mg/dL — AB
Specific Gravity, Urine: 1.034 — ABNORMAL HIGH (ref 1.005–1.030)
UROBILINOGEN UA: 0.2 mg/dL (ref 0.0–1.0)

## 2014-05-09 LAB — COMPREHENSIVE METABOLIC PANEL
ALK PHOS: 56 U/L (ref 39–117)
ALT: 55 U/L — ABNORMAL HIGH (ref 0–53)
AST: 44 U/L — ABNORMAL HIGH (ref 0–37)
Albumin: 5 g/dL (ref 3.5–5.2)
Anion gap: 18 — ABNORMAL HIGH (ref 5–15)
BUN: 19 mg/dL (ref 6–23)
CO2: 21 mmol/L (ref 19–32)
Calcium: 9.6 mg/dL (ref 8.4–10.5)
Chloride: 100 mmol/L (ref 96–112)
Creatinine, Ser: 1.38 mg/dL — ABNORMAL HIGH (ref 0.50–1.35)
GFR, EST AFRICAN AMERICAN: 73 mL/min — AB (ref >90)
GFR, EST NON AFRICAN AMERICAN: 63 mL/min — AB (ref >90)
GLUCOSE: 130 mg/dL — AB (ref 70–99)
Potassium: 3.3 mmol/L — ABNORMAL LOW (ref 3.5–5.1)
Sodium: 139 mmol/L (ref 135–145)
Total Bilirubin: 1.3 mg/dL — ABNORMAL HIGH (ref 0.3–1.2)
Total Protein: 8.3 g/dL (ref 6.0–8.3)

## 2014-05-09 LAB — URINE MICROSCOPIC-ADD ON

## 2014-05-09 LAB — LIPASE, BLOOD: Lipase: 23 U/L (ref 11–59)

## 2014-05-09 MED ORDER — HYDROCODONE-ACETAMINOPHEN 5-325 MG PO TABS: 1.0000 | ORAL_TABLET | ORAL | Status: DC | PRN

## 2014-05-09 MED ORDER — ONDANSETRON HCL 4 MG/2ML IJ SOLN
INTRAMUSCULAR | Status: AC
  Administered 2014-05-09: 4 mg via INTRAVENOUS
  Filled 2014-05-09: qty 2

## 2014-05-09 MED ORDER — ONDANSETRON HCL 4 MG/2ML IJ SOLN
4.0000 mg | Freq: Once | INTRAMUSCULAR | Status: AC
  Administered 2014-05-09: 4 mg via INTRAVENOUS

## 2014-05-09 MED ORDER — PANTOPRAZOLE SODIUM 40 MG IV SOLR
40.0000 mg | Freq: Once | INTRAVENOUS | Status: AC
  Administered 2014-05-09: 40 mg via INTRAVENOUS
  Filled 2014-05-09: qty 40

## 2014-05-09 MED ORDER — ONDANSETRON 4 MG PO TBDP: 8.0000 mg | ORAL_TABLET | Freq: Once | ORAL | Status: DC

## 2014-05-09 MED ORDER — SODIUM CHLORIDE 0.9 % IV BOLUS (SEPSIS)
1000.0000 mL | Freq: Once | INTRAVENOUS | Status: AC
  Administered 2014-05-09: 1000 mL via INTRAVENOUS

## 2014-05-09 MED ORDER — MORPHINE SULFATE 4 MG/ML IJ SOLN
4.0000 mg | Freq: Once | INTRAMUSCULAR | Status: AC
  Administered 2014-05-09: 4 mg via INTRAVENOUS
  Filled 2014-05-09: qty 1

## 2014-05-09 MED ORDER — DICYCLOMINE HCL 20 MG PO TABS
20.0000 mg | ORAL_TABLET | Freq: Two times a day (BID) | ORAL | Status: DC
Start: 2014-05-09 — End: 2014-08-05

## 2014-05-09 MED ORDER — METOCLOPRAMIDE HCL 5 MG/ML IJ SOLN
10.0000 mg | INTRAMUSCULAR | Status: AC
  Administered 2014-05-09: 10 mg via INTRAVENOUS
  Filled 2014-05-09: qty 2

## 2014-05-09 MED ORDER — ONDANSETRON 4 MG PO TBDP: 4.0000 mg | ORAL_TABLET | Freq: Three times a day (TID) | ORAL | Status: DC | PRN

## 2014-05-09 NOTE — ED Notes (Signed)
Pt reports lower abdominal pain that started about 5 am this morning.  Sts he has thrown up a couple of times today.  First emesis was bloody and pt sts rest have been between coffee ground emesis and bile.  Denies any rectal bleeding. Emesis x 20.

## 2014-05-09 NOTE — Discharge Instructions (Signed)
Take the prescribed medication as directed.  Make sure to drink fluids to stay hydrated. Follow-up with Dr. Hilarie Fredrickson-- may need to call and schedule earlier appt. Return to the ED for new or worsening symptoms.

## 2014-05-09 NOTE — ED Provider Notes (Signed)
CSN: [id number]     Arrival date & time 05/09/14  1802 History   First MD Initiated Contact with Patient 05/09/14 1929     Chief Complaint  Patient presents with  . Abdominal Pain  . Hemoptysis     (Consider location/radiation/quality/duration/timing/severity/associated sxs/prior Treatment) The history is provided by the patient and medical records.    This is a 39 year old male with past medical history significant for stomach ulcers, diverticulitis, anxiety, GERD, Barrett's esophagus, hyperlipidemia, hypertension, hernias, presenting to the ED for abdominal pain and emesis. Patient states symptoms started around 0500 this morning.  He states it began with lower abdominal cramping followed by repetitive nausea and vomiting.  States initially there a few streaks of bright red blood, however since that time it has been more bile and stomach acid.  There was 1 episode that looked like a small amount of coffee grounds. States he took a cold shower and splashed some water on his face with temporary relief for a few minutes.  Patient estimates approx 20+ episodes of emesis throughout the day today, 3 since he arrived to ED.  patient has a long-standing GI history with multiple episodes of the same. He denies any blood in his stools, bowel movements have been regular recently. He is freely passing flatus. He denies prior abdominal surgeries. He is followed by GI, Dr. Hilarie Fredrickson.  He is not currently on any type of anticoagulation.  He has been taking his antacids as directed.  Past Medical History  Diagnosis Date  . Stomach ulcer   . Diverticulitis   . Anxiety   . GERD (gastroesophageal reflux disease)   . Barrett's esophagus   . Prediabetes   . Hyperlipidemia   . Hypertension     labile  . Umbilical hernia   . Hiatal hernia    Past Surgical History  Procedure Laterality Date  . Upper gastrointestinal endoscopy     Family History  Problem Relation Age of Onset  . Adopted: Yes   History   Substance Use Topics  . Smoking status: Former Smoker    Quit date: 06/17/2010  . Smokeless tobacco: Never Used  . Alcohol Use: No     Comment: occasional    Review of Systems  Gastrointestinal: Positive for nausea, vomiting and abdominal pain.  All other systems reviewed and are negative.     Allergies  Penicillins  Home Medications   Prior to Admission medications   Medication Sig Start Date End Date Taking? Authorizing Provider  esomeprazole (NEXIUM) 40 MG capsule Take 40 mg by mouth daily at 12 noon.   Yes Historical Provider, MD  ibuprofen (ADVIL,MOTRIN) 800 MG tablet Take 800 mg by mouth every 8 (eight) hours as needed for mild pain.  05/01/14  Yes Historical Provider, MD  promethazine (PHENERGAN) 25 MG suppository Place 1 suppository (25 mg total) rectally every 6 (six) hours as needed for nausea or vomiting. 02/24/14  Yes Lacretia Leigh, MD  HYDROcodone-acetaminophen (NORCO/VICODIN) 5-325 MG per tablet Take 1-2 tablets by mouth every 4 (four) hours as needed for moderate pain or severe pain. Patient not taking: Reported on 05/09/2014 02/24/14   Lacretia Leigh, MD   BP 155/93 mmHg  Pulse 75  Temp(Src) 98.9 F (37.2 C) (Oral)  Resp 22  SpO2 100%   Physical Exam  Constitutional: He is oriented to person, place, and time. He appears well-developed and well-nourished. No distress.  Appears uncomfortable  HENT:  Head: Normocephalic and atraumatic.  Mouth/Throat: Oropharynx is clear and moist.  No blood noted in posterior oropharynx, airway clear, handling secretions well  Eyes: Conjunctivae and EOM are normal. Pupils are equal, round, and reactive to light.  Neck: Normal range of motion. Neck supple.  Cardiovascular: Normal rate, regular rhythm and normal heart sounds.   Pulmonary/Chest: Effort normal and breath sounds normal. No respiratory distress. He has no wheezes.  Abdominal: Soft. Bowel sounds are normal. There is no tenderness. There is no guarding.  Endorses  generalized cramping, no true focal tenderness or peritoneal signs; abdomen remains soft with normal bowel sounds  Musculoskeletal: Normal range of motion.  Neurological: He is alert and oriented to person, place, and time.  Skin: Skin is warm and dry. He is not diaphoretic.  Psychiatric: He has a normal mood and affect.  Nursing note and vitals reviewed.   ED Course  Procedures (including critical care time) Labs Review Labs Reviewed  CBC WITH DIFFERENTIAL/PLATELET - Abnormal; Notable for the following:    WBC 13.5 (*)    Neutrophils Relative % 83 (*)    Neutro Abs 11.2 (*)    Lymphocytes Relative 11 (*)    All other components within normal limits  COMPREHENSIVE METABOLIC PANEL - Abnormal; Notable for the following:    Potassium 3.3 (*)    Glucose, Bld 130 (*)    Creatinine, Ser 1.38 (*)    AST 44 (*)    ALT 55 (*)    Total Bilirubin 1.3 (*)    GFR calc non Af Amer 63 (*)    GFR calc Af Amer 73 (*)    Anion gap 18 (*)    All other components within normal limits  URINALYSIS, ROUTINE W REFLEX MICROSCOPIC - Abnormal; Notable for the following:    Color, Urine AMBER (*)    Specific Gravity, Urine 1.034 (*)    Ketones, ur >80 (*)    Protein, ur 100 (*)    All other components within normal limits  URINE MICROSCOPIC-ADD ON - Abnormal; Notable for the following:    Bacteria, UA FEW (*)    All other components within normal limits  LIPASE, BLOOD    Imaging Review No results found.   EKG Interpretation None      MDM   Final diagnoses:  Nausea and vomiting, vomiting of unspecified type  Abdominal pain, unspecified abdominal location   39 year old male with diffuse abdominal cramping, nausea, and vomiting which began earlier this morning. He apparently had some bright red streaking in his vomit as well as one episode of coffee-ground emesis, but states throughout the remainder of the afternoon and emesis has been stomach contents and bile. On exam, patient does appear  somewhat uncomfortable but is nontoxic in appearance. He endorses generalized abdominal cramping but his exam is without focal tenderness or peritonitis. He has no evidence of active bleeding in his oropharynx.  Lab work is overall reassuring, H/H stable.  After IV fluids, Reglan, Protonix, and morphine patient's symptoms have completely resolved. He has had no active vomiting while in my care.  His VS remain stable and he is currently tolerating PO.  Repeat abdominal exam benign. Suspect his blood streaked emesis may have been due to small mallory weiss tear from repetitive retching episodes throughout the day.  No signs of esophageal rupture or upper GI bleed.  Patient will be d/c home with zofran, vicodin, bentyl.  He will follow-up with his gastroenterologist, Dr. Hilarie Fredrickson.  Discussed plan with patient, he/she acknowledged understanding and agreed with plan of care.  Return precautions  given for new or worsening symptoms.  Larene Pickett, PA-C 05/10/14 0026  Tanna Furry, MD 05/12/14 [phone]

## 2014-06-06 ENCOUNTER — Ambulatory Visit: Payer: Self-pay | Admitting: Internal Medicine

## 2014-07-12 ENCOUNTER — Encounter: Payer: Self-pay | Admitting: *Deleted

## 2014-08-05 ENCOUNTER — Encounter: Payer: Self-pay | Admitting: Internal Medicine

## 2014-08-05 ENCOUNTER — Other Ambulatory Visit (INDEPENDENT_AMBULATORY_CARE_PROVIDER_SITE_OTHER): Payer: BLUE CROSS/BLUE SHIELD

## 2014-08-05 ENCOUNTER — Ambulatory Visit (INDEPENDENT_AMBULATORY_CARE_PROVIDER_SITE_OTHER): Payer: BLUE CROSS/BLUE SHIELD | Admitting: Internal Medicine

## 2014-08-05 VITALS — BP 124/84 | HR 80 | Ht 69.0 in | Wt 211.0 lb

## 2014-08-05 DIAGNOSIS — R112 Nausea with vomiting, unspecified: Secondary | ICD-10-CM

## 2014-08-05 DIAGNOSIS — R1030 Lower abdominal pain, unspecified: Secondary | ICD-10-CM | POA: Diagnosis not present

## 2014-08-05 DIAGNOSIS — K2270 Barrett's esophagus without dysplasia: Secondary | ICD-10-CM

## 2014-08-05 LAB — COMPREHENSIVE METABOLIC PANEL
ALBUMIN: 4.7 g/dL (ref 3.5–5.2)
ALT: 23 U/L (ref 0–53)
AST: 22 U/L (ref 0–37)
Alkaline Phosphatase: 61 U/L (ref 39–117)
BUN: 16 mg/dL (ref 6–23)
CALCIUM: 9.6 mg/dL (ref 8.4–10.5)
CO2: 29 mEq/L (ref 19–32)
CREATININE: 1.22 mg/dL (ref 0.40–1.50)
Chloride: 101 mEq/L (ref 96–112)
GFR: 70.07 mL/min (ref >60.00)
GLUCOSE: 108 mg/dL — AB (ref 70–99)
Potassium: 4.3 mEq/L (ref 3.5–5.1)
SODIUM: 136 meq/L (ref 135–145)
TOTAL PROTEIN: 7.9 g/dL (ref 6.0–8.3)
Total Bilirubin: 0.9 mg/dL (ref 0.2–1.2)

## 2014-08-05 LAB — CBC WITH DIFFERENTIAL/PLATELET
BASOS PCT: 0.7 % (ref 0.0–3.0)
Basophils Absolute: 0.1 10*3/uL (ref 0.0–0.1)
EOS ABS: 0.8 10*3/uL — AB (ref 0.0–0.7)
EOS PCT: 8.2 % — AB (ref 0.0–5.0)
HEMATOCRIT: 50.5 % (ref 39.0–52.0)
HEMOGLOBIN: 16.6 g/dL (ref 13.0–17.0)
Lymphocytes Relative: 17.6 % (ref 12.0–46.0)
Lymphs Abs: 1.6 10*3/uL (ref 0.7–4.0)
MCHC: 32.8 g/dL (ref 30.0–36.0)
MCV: 83.6 fl (ref 78.0–100.0)
MONOS PCT: 6.6 % (ref 3.0–12.0)
Monocytes Absolute: 0.6 10*3/uL (ref 0.1–1.0)
NEUTROS ABS: 6.1 10*3/uL (ref 1.4–7.7)
Neutrophils Relative %: 66.9 % (ref 43.0–77.0)
Platelets: 257 10*3/uL (ref 150.0–400.0)
RBC: 6.05 Mil/uL — ABNORMAL HIGH (ref 4.22–5.81)
RDW: 14.3 % (ref 11.5–15.5)
WBC: 9.2 10*3/uL (ref 4.0–10.5)

## 2014-08-05 LAB — HIGH SENSITIVITY CRP: CRP HIGH SENSITIVITY: 3.54 mg/L (ref 0.000–5.000)

## 2014-08-05 MED ORDER — NA SULFATE-K SULFATE-MG SULF 17.5-3.13-1.6 GM/177ML PO SOLN: ORAL | Status: DC

## 2014-08-05 MED ORDER — CILIDINIUM-CHLORDIAZEPOXIDE 2.5-5 MG PO CAPS: 1.0000 | ORAL_CAPSULE | Freq: Three times a day (TID) | ORAL | Status: DC | PRN

## 2014-08-05 NOTE — Progress Notes (Signed)
Subjective:    Patient ID: Warren Thomas, male    DOB: January 22, 1976, 39 y.o.   MRN: [id number]  HPI Warren Thomas is a 39 year old male with past medical history of Barrett's esophagus, episodic left lower abdominal pain with nausea and vomiting who is seen in follow-up. He was last seen in the office approximately 3 years ago. Barrett's esophagus diagnosed by biopsy without dysplasia in April and December 2013. He has been maintained on Nexium 40 mg daily. No heartburn, dysphagia or odynophagia.  Unfortunately for him, he has continued to have episodic abdominal pain followed by nausea and vomiting. These attacks have been happening more frequently and had been more debilitating. He was recently within the past month, admitted in [place] with an attack of left lower quadrant abdominal pain followed by severe nausea and intractable vomiting. He was admitted for 4 days. Reportedly CT scan was done and suggested colitis. He denies diarrhea, blood in his stool or melena associated with these episodes. Prior to this he was seen in a local ER in March with similar complaints but was not admitted. He's tried a food diary without trigger. Been happening about every month. Afterwards he is very sore has poor energy and appetite. Usually each episode is associated with weight loss until he can eat normally again. He reports attacks are often preceded by extreme lethargy and also anxiety.  Related to his anxiety he took Xanax for some time and it did help but he is not taking anything chronically for anxiety. He uses as needed Zofran and promethazine suppositories to try to abort an attack but usually he requires IV fluid and pain control and an ER visit. Of note he in the past has had leukocytosis associated with these attacks.  Denies rash. Denies joint pain, denies mouth ulcers, denies jaundice, no edema  He did see Dr. Hassell Done regarding umbilical hernia. He reports weight loss was recommended prior  to consideration of repair  Review of Systems As per history of present illness, otherwise negative  Current Medications, Allergies, Past Medical History, Past Surgical History, Family History and Social History were reviewed in [employer] record.     Objective:   Physical Exam BP 124/84 mmHg  Pulse 80  Ht 5\' 9"  (1.753 m)  Wt 211 lb (95.709 kg)  BMI 31.15 kg/m2 Constitutional: Well-developed and well-nourished. No distress. HEENT: Normocephalic and atraumatic. Oropharynx is clear and moist. No oropharyngeal exudate. Conjunctivae are normal.  No scleral icterus. Neck: Neck supple. Trachea midline. Cardiovascular: Normal rate, regular rhythm and intact distal pulses. No M/R/G Pulmonary/chest: Effort normal and breath sounds normal. No wheezing, rales or rhonchi. Abdominal: Soft, nontender, nondistended. Bowel sounds active throughout. There are no masses palpable. Soft umbilical hernia nontender Extremities: no clubbing, cyanosis, or edema Lymphadenopathy: No cervical adenopathy noted. Neurological: Alert and oriented to person place and time. Skin: Skin is warm and dry. No rashes noted. Psychiatric: Normal mood and affect. Behavior is normal.  CBC    Component Value Date/Time   WBC 9.2 08/05/2014 1606   RBC 6.05* 08/05/2014 1606   HGB 16.6 08/05/2014 1606   HCT 50.5 08/05/2014 1606   PLT 257.0 08/05/2014 1606   MCV 83.6 08/05/2014 1606   MCH 27.8 05/09/2014 1859   MCHC 32.8 08/05/2014 1606   RDW 14.3 08/05/2014 1606   LYMPHSABS 1.6 08/05/2014 1606   MONOABS 0.6 08/05/2014 1606   EOSABS 0.8* 08/05/2014 1606   BASOSABS 0.1 08/05/2014 1606    CMP  Component Value Date/Time   NA 136 08/05/2014 1606   K 4.3 08/05/2014 1606   CL 101 08/05/2014 1606   CO2 29 08/05/2014 1606   GLUCOSE 108* 08/05/2014 1606   BUN 16 08/05/2014 1606   CREATININE 1.22 08/05/2014 1606   CREATININE 1.35 08/14/2013 1441   CALCIUM 9.6 08/05/2014 1606   PROT 7.9  08/05/2014 1606   ALBUMIN 4.7 08/05/2014 1606   AST 22 08/05/2014 1606   ALT 23 08/05/2014 1606   ALKPHOS 61 08/05/2014 1606   BILITOT 0.9 08/05/2014 1606   GFRNONAA 63* 05/09/2014 1859   GFRNONAA 66 08/14/2013 1441   GFRAA 73* 05/09/2014 1859   GFRAA 76 08/14/2013 1441      Assessment & Plan:  39 year old male with past medical history of Barrett's esophagus, episodic left lower abdominal pain with nausea and vomiting who is seen in follow-up  1. Episodic abdominal pain/nausea and vomiting -- I would like to review records from recent hospitalization in [place]. These are requested today including CT. Reportedly colitis was suggested by CT. I have recommended upper endoscopy for Barrett's surveillance, see #2 but also colonoscopy to evaluate left-sided abdominal pain and intermittent nausea and vomiting. Also to evaluate and rule out possible colitis. Symptoms are debilitating and differential includes obstructive, spastic and even inflammatory. CBC, CMP today. Intestinal angioedema is in the differential check C1 esterase and complement C4. Acute intermittent porphyria is also in the differential and he does have some psychiatric symptoms as well. Will check urinary PBG. I will prescribe Librax to be used on an as-needed basis only for abdominal discomfort and hopefully to help abort these attacks. He can continue Zofran and promethazine on an as-needed basis.  2. GERD with history of Barrett's -- continue daily PPI with Nexium 40 mg daily and endoscopy recommended for biopsies given it has been 3 years. We discussed the procedures today including the risks and benefits and he is agreeable to proceed

## 2014-08-05 NOTE — Patient Instructions (Addendum)
Your physician has requested that you go to the basement for the lab work before leaving today.  You have been scheduled for an endoscopy and colonoscopy. Please follow the written instructions given to you at your visit today. Please pick up your prep supplies at the pharmacy within the next 1-3 days. If you use inhalers (even only as needed), please bring them with you on the day of your procedure.  We have sent the following medications to your pharmacy for you to pick up at your convenience:  Librax, faxed to pharmacy.  Continue your Nexium.   I appreciate the opportunity to care for you.

## 2014-08-06 ENCOUNTER — Telehealth: Payer: Self-pay

## 2014-08-06 ENCOUNTER — Other Ambulatory Visit: Payer: Self-pay

## 2014-08-06 DIAGNOSIS — R1032 Left lower quadrant pain: Secondary | ICD-10-CM

## 2014-08-06 DIAGNOSIS — R112 Nausea with vomiting, unspecified: Secondary | ICD-10-CM

## 2014-08-06 LAB — C4 COMPLEMENT: C4 Complement: 25 mg/dL (ref 10–40)

## 2014-08-06 NOTE — Progress Notes (Signed)
Left message on machine to call back  

## 2014-08-06 NOTE — Telephone Encounter (Signed)
I spoke with Dr Hilarie Fredrickson and the correct tests were ordered and a message was left on the pt's home number to return call.

## 2014-08-06 NOTE — Telephone Encounter (Signed)
Abelino, Tippin - 08/05/14 >','<< Less Detail',event)" href="javascript:;">More Detail >>   Jerene Bears, MD   Sent: Mon August 05, 2014 5:38 PM    To: Patti E Martinique, CMA        Message     PJ, can we please add on a urinary PBG (Porphobilinogen) test. If you have trouble finding it please let me know. He will have to come back to get the urine collection container.    THanks    [place].    Dr. Hilarie Fredrickson, there is a random then a 24 hour urine which one do you want?   Left message for patient to call back

## 2014-08-06 NOTE — Telephone Encounter (Signed)
Patient notified to come for labs at his convenience

## 2014-08-07 ENCOUNTER — Other Ambulatory Visit: Payer: BLUE CROSS/BLUE SHIELD

## 2014-08-07 DIAGNOSIS — R1032 Left lower quadrant pain: Secondary | ICD-10-CM

## 2014-08-07 DIAGNOSIS — R112 Nausea with vomiting, unspecified: Secondary | ICD-10-CM

## 2014-08-07 LAB — C1 ESTERASE INHIBITOR: C1INH SerPl-mCnc: 35 mg/dL (ref 21–39)

## 2014-08-08 ENCOUNTER — Ambulatory Visit (AMBULATORY_SURGERY_CENTER): Payer: BLUE CROSS/BLUE SHIELD | Admitting: Internal Medicine

## 2014-08-08 ENCOUNTER — Encounter: Payer: Self-pay | Admitting: Internal Medicine

## 2014-08-08 VITALS — BP 121/82 | HR 76 | Temp 97.2°F | Resp 18 | Ht 69.0 in | Wt 211.0 lb

## 2014-08-08 DIAGNOSIS — D125 Benign neoplasm of sigmoid colon: Secondary | ICD-10-CM

## 2014-08-08 DIAGNOSIS — R1030 Lower abdominal pain, unspecified: Secondary | ICD-10-CM | POA: Diagnosis present

## 2014-08-08 DIAGNOSIS — K2270 Barrett's esophagus without dysplasia: Secondary | ICD-10-CM | POA: Diagnosis not present

## 2014-08-08 DIAGNOSIS — R112 Nausea with vomiting, unspecified: Secondary | ICD-10-CM | POA: Diagnosis not present

## 2014-08-08 DIAGNOSIS — K635 Polyp of colon: Secondary | ICD-10-CM

## 2014-08-08 MED ORDER — SODIUM CHLORIDE 0.9 % IV SOLN: 500.0000 mL | INTRAVENOUS | Status: DC

## 2014-08-08 NOTE — Progress Notes (Signed)
Report to PACU, RN, vss, BBS= Clear.  

## 2014-08-08 NOTE — Progress Notes (Signed)
Called to room to assist during endoscopic procedure.  Patient ID and intended procedure confirmed with present staff. Received instructions for my participation in the procedure from the performing physician.  

## 2014-08-08 NOTE — Op Note (Signed)
[place]  [address] [place], [id number]   ENDOSCOPY PROCEDURE REPORT  PATIENT: Warren Thomas, Warren Thomas  MR#: [id number] BIRTHDATE: 08/14/1975 , 38  yrs. old GENDER: male ENDOSCOPIST: Jerene Bears, MD PROCEDURE DATE:  08/08/2014 PROCEDURE:  EGD, diagnostic and EGD w/ biopsy ASA CLASS:     Class II INDICATIONS:  follow up of Barrett's esophagus and intermittent nausea and vomiting. MEDICATIONS: Monitored anesthesia care and Propofol 350 mg IV TOPICAL ANESTHETIC: none  DESCRIPTION OF PROCEDURE: After the risks benefits and alternatives of the procedure were thoroughly explained, informed consent was obtained.  The LB [id number] endoscope was introduced through the mouth and advanced to the second portion of the duodenum , Without limitations.  The instrument was slowly withdrawn as the mucosa was fully examined.   ESOPHAGUS: An inlet patch was located in the proximal esophagus (previously seen and biopsied).   There was a 3cm segment of Barrett's esophagus found in the lower third of the esophagus, including 2 islands of salmon-colored mucosa.  There was no nodular mucosa noted in the Barrett's segment.  C1M3.  Multiple biopsies were performed using cold forceps.  STOMACH: The mucosa of the stomach appeared normal.  DUODENUM: The duodenal mucosa showed no abnormalities in the bulb and 2nd part of the duodenum.  Retroflexed views revealed a 2 cm hiatal hernia.     The scope was then withdrawn from the patient and the procedure completed.  COMPLICATIONS: There were no immediate complications.  ENDOSCOPIC IMPRESSION: 1.   Inlet patch in the proximal esophagus 2.   There was a 3cm segment of Barrett's esophagus found in the lower third of the esophagus; multiple biopsies were performed 3.   The mucosa of the stomach appeared normal 4.   The duodenal mucosa showed no abnormalities in the bulb and 2nd part of the duodenum  RECOMMENDATIONS: 1.  Continue  taking your PPI (antiacid medicine) once daily.  It is best to be taken 20-30 minutes prior to breakfast meal. 2.  Await biopsy results   eSigned:  Jerene Bears, MD 08/08/2014 4:27 PM [id number] Melford Aase, MD and The Patient

## 2014-08-08 NOTE — Patient Instructions (Signed)

## 2014-08-08 NOTE — Op Note (Signed)
[place]  [address] [place], [id number]   COLONOSCOPY PROCEDURE REPORT  PATIENT: Warren Thomas, Warren Thomas  MR#: [id number] BIRTHDATE: 10/27/1975 , 38  yrs. old GENDER: male ENDOSCOPIST: Jerene Bears, MD PROCEDURE DATE:  08/08/2014 PROCEDURE:   Colonoscopy, diagnostic and Colonoscopy with snare polypectomy First Screening Colonoscopy - Avg.  risk and is 50 yrs.  old or older - No.  Prior Negative Screening - Now for repeat screening. N/A  History of Adenoma - Now for follow-up colonoscopy & has been > or = to 3 yrs.  N/A  Polyps removed today? Yes ASA CLASS:   Class II INDICATIONS:lower abdominal pain, episodic abdominal pain with severe nausea and vomiting. MEDICATIONS: Propofol 550 mg IV for combined procedure, Residual sedation present  DESCRIPTION OF PROCEDURE:   After the risks benefits and alternatives of the procedure were thoroughly explained, informed consent was obtained.  The digital rectal exam revealed no rectal mass.   The LB [id number]  endoscope was introduced through the anus and advanced to the terminal ileum which was intubated for a short distance. No adverse events experienced.   The quality of the prep was good.  (Suprep was used)  The instrument was then slowly withdrawn as the colon was fully examined. Estimated blood loss is zero unless otherwise noted in this procedure report.   COLON FINDINGS: The examined terminal ileum appeared to be normal. There was mild diverticulosis noted in the ascending colon, at the hepatic flexure, and in the sigmoid colon.   A sessile polyp measuring 5 mm in size was found in the sigmoid colon.  A polypectomy was performed with a cold snare.  The resection was complete, the polyp tissue was completely retrieved and sent to histology.  Retroflexed views revealed internal hemorrhoids. The time to cecum = 3.1 Withdrawal time = 8.2   The scope was withdrawn and the procedure completed.  COMPLICATIONS:  There were no immediate complications.  ENDOSCOPIC IMPRESSION: 1.   The examined terminal ileum appeared to be normal 2.   Mild diverticulosis was noted in the ascending colon, at the hepatic flexure, and in the sigmoid colon 3.   Sessile polyp was found in the sigmoid colon; polypectomy was performed with a cold snare  RECOMMENDATIONS: 1.  Await pathology results 2.  High fiber diet 3.  No endoscopic cause of intermittent attacks of abdominal pain with intractable nausea and vomiting.  Await additional lab/urine testing  eSigned:  Jerene Bears, MD 08/08/2014 4:31 PM   cc: Unk Pinto, MD and The Patient   PATIENT NAME:  Warren Thomas, Warren Thomas MR#: [id number]

## 2014-08-09 ENCOUNTER — Telehealth: Payer: Self-pay | Admitting: *Deleted

## 2014-08-09 NOTE — Telephone Encounter (Signed)
Left message to call us if necessary. 

## 2014-08-12 LAB — PORPHOBILINOGEN, QUANT, RANDOM U: Quantitative Porphobilinogen: 0.4 mg/g creat (ref <2.0)

## 2014-08-15 ENCOUNTER — Encounter: Payer: Self-pay | Admitting: Internal Medicine

## 2014-08-16 LAB — PORPHYRINS, FRACTIONATED, RANDOM URINE
COPROPORPHYRIN I: 17.1 ug/g{creat} (ref 5.6–28.6)
Coproporphyrin III (PORFRU): 54.7 mcg/g creat (ref 4.1–76.4)
HEPTACARBOXYPORPHYRIN: 1.6 ug/g{creat} (ref <=2.9)
HEXACARBOXYPORPHYRIN: 0.9 mcg/g creat (ref <=5.4)
PENTACARBOXYPORPHYRIN: 0.5 mcg/g creat (ref <=3.5)
TOTAL PORPHYRINS: 91.6 mcg/g creat (ref 23.3–132.4)
Uroporphyrin I: 14.9 mcg/g creat (ref 3.1–18.2)
Uroporphyrin III (PORFRU): 1.9 mcg/g creat (ref <=4.8)

## 2015-05-16 ENCOUNTER — Ambulatory Visit (INDEPENDENT_AMBULATORY_CARE_PROVIDER_SITE_OTHER): Payer: BLUE CROSS/BLUE SHIELD | Admitting: Physician Assistant

## 2015-05-16 ENCOUNTER — Encounter: Payer: Self-pay | Admitting: Physician Assistant

## 2015-05-16 ENCOUNTER — Other Ambulatory Visit: Payer: Self-pay

## 2015-05-16 VITALS — BP 158/100 | HR 88 | Temp 97.9°F | Resp 16 | Ht 69.0 in | Wt 211.6 lb

## 2015-05-16 DIAGNOSIS — D649 Anemia, unspecified: Secondary | ICD-10-CM

## 2015-05-16 DIAGNOSIS — R7303 Prediabetes: Secondary | ICD-10-CM | POA: Diagnosis not present

## 2015-05-16 DIAGNOSIS — E559 Vitamin D deficiency, unspecified: Secondary | ICD-10-CM | POA: Diagnosis not present

## 2015-05-16 DIAGNOSIS — I10 Essential (primary) hypertension: Secondary | ICD-10-CM

## 2015-05-16 DIAGNOSIS — R42 Dizziness and giddiness: Secondary | ICD-10-CM | POA: Diagnosis not present

## 2015-05-16 DIAGNOSIS — E785 Hyperlipidemia, unspecified: Secondary | ICD-10-CM

## 2015-05-16 DIAGNOSIS — Z79899 Other long term (current) drug therapy: Secondary | ICD-10-CM | POA: Diagnosis not present

## 2015-05-16 DIAGNOSIS — R5383 Other fatigue: Secondary | ICD-10-CM | POA: Diagnosis not present

## 2015-05-16 DIAGNOSIS — R002 Palpitations: Secondary | ICD-10-CM

## 2015-05-16 LAB — CBC WITH DIFFERENTIAL/PLATELET
BASOS ABS: 0.1 10*3/uL (ref 0.0–0.1)
Basophils Relative: 1 % (ref 0–1)
Eosinophils Absolute: 0.2 10*3/uL (ref 0.0–0.7)
Eosinophils Relative: 2 % (ref 0–5)
HEMATOCRIT: 47.4 % (ref 39.0–52.0)
HEMOGLOBIN: 15.9 g/dL (ref 13.0–17.0)
LYMPHS PCT: 20 % (ref 12–46)
Lymphs Abs: 1.8 10*3/uL (ref 0.7–4.0)
MCH: 27.5 pg (ref 26.0–34.0)
MCHC: 33.5 g/dL (ref 30.0–36.0)
MCV: 81.9 fL (ref 78.0–100.0)
MPV: 10.2 fL (ref 8.6–12.4)
Monocytes Absolute: 0.6 10*3/uL (ref 0.1–1.0)
Monocytes Relative: 7 % (ref 3–12)
NEUTROS ABS: 6.2 10*3/uL (ref 1.7–7.7)
Neutrophils Relative %: 70 % (ref 43–77)
Platelets: 271 10*3/uL (ref 150–400)
RBC: 5.79 MIL/uL (ref 4.22–5.81)
RDW: 13.4 % (ref 11.5–15.5)
WBC: 8.8 10*3/uL (ref 4.0–10.5)

## 2015-05-16 LAB — HEPATIC FUNCTION PANEL
ALT: 21 U/L (ref 9–46)
AST: 19 U/L (ref 10–40)
Albumin: 4.7 g/dL (ref 3.6–5.1)
Alkaline Phosphatase: 50 U/L (ref 40–115)
BILIRUBIN INDIRECT: 0.2 mg/dL (ref 0.2–1.2)
Bilirubin, Direct: 0.1 mg/dL (ref <=0.2)
Total Bilirubin: 0.3 mg/dL (ref 0.2–1.2)
Total Protein: 7.7 g/dL (ref 6.1–8.1)

## 2015-05-16 LAB — MAGNESIUM: Magnesium: 2.1 mg/dL (ref 1.5–2.5)

## 2015-05-16 LAB — IRON AND TIBC
%SAT: 21 % (ref 15–60)
IRON: 62 ug/dL (ref 50–180)
TIBC: 292 ug/dL (ref 250–425)
UIBC: 230 ug/dL (ref 125–400)

## 2015-05-16 LAB — BASIC METABOLIC PANEL WITH GFR
BUN: 22 mg/dL (ref 7–25)
CO2: 28 mmol/L (ref 20–31)
Calcium: 9.8 mg/dL (ref 8.6–10.3)
Chloride: 101 mmol/L (ref 98–110)
Creat: 1.34 mg/dL (ref 0.60–1.35)
GFR, EST AFRICAN AMERICAN: 76 mL/min (ref >=60)
GFR, EST NON AFRICAN AMERICAN: 66 mL/min (ref >=60)
Glucose, Bld: 110 mg/dL — ABNORMAL HIGH (ref 65–99)
POTASSIUM: 4.5 mmol/L (ref 3.5–5.3)
Sodium: 141 mmol/L (ref 135–146)

## 2015-05-16 LAB — TESTOSTERONE: Testosterone: 430 ng/dL (ref 250–827)

## 2015-05-16 LAB — TSH: TSH: 2.33 mIU/L (ref 0.40–4.50)

## 2015-05-16 LAB — LIPID PANEL
Cholesterol: 243 mg/dL — ABNORMAL HIGH (ref 125–200)
HDL: 49 mg/dL (ref >=40)
LDL Cholesterol: 168 mg/dL — ABNORMAL HIGH (ref <130)
TRIGLYCERIDES: 129 mg/dL (ref <150)
Total CHOL/HDL Ratio: 5 Ratio (ref <=5.0)
VLDL: 26 mg/dL (ref <30)

## 2015-05-16 LAB — FERRITIN: FERRITIN: 120 ng/mL (ref 20–380)

## 2015-05-16 LAB — HEMOGLOBIN A1C
Hgb A1c MFr Bld: 5.8 % — ABNORMAL HIGH (ref <5.7)
Mean Plasma Glucose: 120 mg/dL — ABNORMAL HIGH (ref <117)

## 2015-05-16 LAB — VITAMIN B12: Vitamin B-12: 777 pg/mL (ref 200–1100)

## 2015-05-16 MED ORDER — ATENOLOL 50 MG PO TABS: 50.0000 mg | ORAL_TABLET | Freq: Every day | ORAL | Status: AC

## 2015-05-16 NOTE — Progress Notes (Signed)
Assessment and Plan:  1. Hypertension -? If HA is from HTN- will start on atenolol at night- follow up 2-4 weeks, - monitor blood pressure at home. Continue DASH diet.  - Reminder to go to the ER if any CP, SOB, nausea, dizziness, severe HA, changes vision/speech, left arm numbness and tingling and jaw pain.  2. Cholesterol -Continue diet and exercise. Check cholesterol.   3. Prediabetes  -Continue diet and exercise. Check A1C  4. Vitamin D Def - check level and continue medications.   5. Medication management - Magnesium  6. Dizziness Seems orthostatic, check labs especially for anemia with ulcer history, versus depression/anxiety- patient declines medications at this time. - EKG 12-Lead  7. Palpitations Check labs, atenolol at night for HTN - EKG 12-Lead  8. Anemia, unspecified anemia type Check labs - Iron and TIBC - Ferritin - Vitamin B12  9. Other fatigue Check labs, EKG, if all normal ? From depression/anxiety- declines meds at this time Will do close follow up.  - Urinalysis, Routine w reflex microscopic (not at [facility]) - Iron and TIBC - Ferritin - Vitamin B12 - EKG 12-Lead - Testosterone   Continue diet and meds as discussed. Further disposition pending results of labs. Over 30 minutes of exam, counseling, chart review, and critical decision making was performed  HPI 40 y.o. male  presents for 3 month follow up on hypertension, cholesterol, prediabetes, and vitamin D deficiency.   His blood pressure has not been controlled at home, today their BP is BP: (!) 158/100 mmHg  He complains of increasing headaches, some dizziness with standing, and cold feet.  He has been having dizziness with standing for a year, feels unstable and room spinning, drinks water that helps, will last 4-5 mins, feels palpitations and flushing during this time, has been under increased stress, lost job in August. Has had decreased sleep x 6 months. Denies any alcohol/drug use.   Increasing HA x 1 month one and off, 15 in last month, tylenol will help occ.  He is on an ABX for cavity.   He does not workout. He denies chest pain, shortness of breath, dizziness.  He is not on cholesterol medication and denies myalgias. His cholesterol is not at goal. The cholesterol last visit was:   Lab Results  Component Value Date   CHOL 227* 08/14/2013   HDL 49 08/14/2013   LDLCALC 139* 08/14/2013   LDLDIRECT 158.3 06/11/2011   TRIG 194* 08/14/2013   CHOLHDL 4.6 08/14/2013    He has been working on diet and exercise for prediabetes, and denies paresthesia of the feet, polydipsia, polyuria and visual disturbances. Last A1C in the office was:  Lab Results  Component Value Date   HGBA1C 5.9* 08/14/2013   Patient is on Vitamin D supplement.   Lab Results  Component Value Date   VD25OH 41 08/14/2013      Current Medications:  Current Outpatient Prescriptions on File Prior to Visit  Medication Sig Dispense Refill  . esomeprazole (NEXIUM) 40 MG capsule Take 40 mg by mouth daily at 12 noon.     No current facility-administered medications on file prior to visit.   Medical History:  Past Medical History  Diagnosis Date  . Stomach ulcer   . Diverticulitis   . Anxiety   . GERD (gastroesophageal reflux disease)   . Barrett's esophagus   . Prediabetes   . Hyperlipidemia   . Hypertension     labile  . Umbilical hernia   . Hiatal hernia   .  Esophagitis    Allergies:  Allergies  Allergen Reactions  . Penicillins Anaphylaxis    Throat swelling as an infant     Review of Systems:  Review of Systems  Constitutional: Positive for malaise/fatigue. Negative for fever, chills, weight loss and diaphoresis.  HENT: Negative for congestion, ear discharge, ear pain, hearing loss, nosebleeds, sore throat and tinnitus.   Eyes: Negative.   Respiratory: Negative.  Negative for cough, shortness of breath and stridor.   Cardiovascular: Positive for palpitations. Negative for  chest pain, orthopnea, claudication, leg swelling and PND.  Gastrointestinal: Negative.   Genitourinary: Negative.   Musculoskeletal: Positive for back pain. Negative for myalgias, joint pain, falls and neck pain.  Skin: Negative.   Neurological: Positive for dizziness and headaches. Negative for tingling, tremors, sensory change, speech change, focal weakness, seizures, loss of consciousness and weakness.  Psychiatric/Behavioral: Positive for depression. Negative for suicidal ideas, hallucinations, memory loss and substance abuse. The patient is nervous/anxious and has insomnia (x 6 months).     Family history- Review and unchanged Social history- Review and unchanged Physical Exam: BP 158/100 mmHg  Pulse 88  Temp(Src) 97.9 F (36.6 C) (Temporal)  Resp 16  Ht 5\' 9"  (1.753 m)  Wt 211 lb 9.6 oz (95.981 kg)  BMI 31.23 kg/m2  SpO2 99% Wt Readings from Last 3 Encounters:  05/16/15 211 lb 9.6 oz (95.981 kg)  08/08/14 211 lb (95.709 kg)  08/05/14 211 lb (95.709 kg)   General Appearance: Well nourished, in no apparent distress. Eyes: PERRLA, EOMs, conjunctiva no swelling or erythema Sinuses: No Frontal/maxillary tenderness ENT/Mouth: Ext aud canals clear, TMs without erythema, bulging. No erythema, swelling, or exudate on post pharynx.  Tonsils not swollen or erythematous. Hearing normal.  Neck: Supple, thyroid normal.  Respiratory: Respiratory effort normal, BS equal bilaterally without rales, rhonchi, wheezing or stridor.  Cardio: RRR with no MRGs. Brisk peripheral pulses without edema.  Abdomen: Soft, + BS,  Non tender, no guarding, rebound, hernias, masses. Lymphatics: Non tender without lymphadenopathy.  Musculoskeletal: Full ROM, 5/5 strength, Normal gait Skin: Warm, dry without rashes, lesions, ecchymosis.  Neuro: Cranial nerves intact. Normal muscle tone, no cerebellar symptoms. Psych: Awake and oriented X 3, normal affect, Insight and Judgment appropriate.    Vicie Mutters, PA-C 10:45 AM [facility]

## 2015-05-16 NOTE — Patient Instructions (Signed)
Fatigue °Fatigue is feeling tired all of the time, a lack of energy, or a lack of motivation. Occasional or mild fatigue is often a normal response to activity or life in general. However, long-lasting (chronic) or extreme fatigue may indicate an underlying medical condition. °HOME CARE INSTRUCTIONS  °Watch your fatigue for any changes. The following actions may help to lessen any discomfort you are feeling: °· Talk to your health care provider about how much sleep you need each night. Try to get the required amount every night. °· Take medicines only as directed by your health care provider. °· Eat a healthy and nutritious diet. Ask your health care provider if you need help changing your diet. °· Drink enough fluid to keep your urine clear or pale yellow. °· Practice ways of relaxing, such as yoga, meditation, massage therapy, or acupuncture. °· Exercise regularly.   °· Change situations that cause you stress. Try to keep your work and personal routine reasonable. °· Do not abuse illegal drugs. °· Limit alcohol intake to no more than 1 drink per day for nonpregnant women and 2 drinks per day for men. One drink equals 12 ounces of beer, 5 ounces of wine, or 1½ ounces of hard liquor. °· Take a multivitamin, if directed by your health care provider. °SEEK MEDICAL CARE IF:  °· Your fatigue does not get better. °· You have a fever.   °· You have unintentional weight loss or gain. °· You have headaches.   °· You have difficulty:   °¨ Falling asleep. °¨ Sleeping throughout the night. °· You feel angry, guilty, anxious, or sad.    °· You are unable to have a bowel movement (constipation).   °· You skin is dry.    °· Your legs or another part of your body is swollen.   °SEEK IMMEDIATE MEDICAL CARE IF:  °· You feel confused.   °· Your vision is blurry. °· You feel faint or pass out.   °· You have a severe headache.   °· You have severe abdominal, pelvic, or back pain.   °· You have chest pain, shortness of breath, or an  irregular or fast heartbeat.   °· You are unable to urinate or you urinate less than normal.   °· You develop abnormal bleeding, such as bleeding from the rectum, vagina, nose, lungs, or nipples. °· You vomit blood.    °· You have thoughts about harming yourself or committing suicide.   °· You are worried that you might harm someone else.   °  °This information is not intended to replace advice given to you by your health care provider. Make sure you discuss any questions you have with your health care provider. °  °Document Released: 12/13/2006 Document Revised: 03/08/2014 Document Reviewed: 06/19/2013 °Elsevier Interactive Patient Education ©2016 [employer] ° °Dizziness °Dizziness is a common problem. It is a feeling of unsteadiness or light-headedness. You may feel like you are about to faint. Dizziness can lead to injury if you stumble or fall. Anyone can become dizzy, but dizziness is more common in older adults. This condition can be caused by a number of things, including medicines, dehydration, or illness. °HOME CARE INSTRUCTIONS °Taking these steps may help with your condition: °Eating and Drinking °· Drink enough fluid to keep your urine clear or pale yellow. This helps to keep you from becoming dehydrated. Try to drink more clear fluids, such as water. °· Do not drink alcohol. °· Limit your caffeine intake if directed by your health care provider. °· Limit your salt intake if directed by your health care provider. °Activity °·   Avoid making quick movements. °¨ Rise slowly from chairs and steady yourself until you feel okay. °¨ In the morning, first sit up on the side of the bed. When you feel okay, stand slowly while you hold onto something until you know that your balance is fine. °· Move your legs often if you need to stand in one place for a long time. Tighten and relax your muscles in your legs while you are standing. °· Do not drive or operate heavy machinery if you feel dizzy. °· Avoid bending  down if you feel dizzy. Place items in your home so that they are easy for you to reach without leaning over. °Lifestyle °· Do not use any tobacco products, including cigarettes, chewing tobacco, or electronic cigarettes. If you need help quitting, ask your health care provider. °· Try to reduce your stress level, such as with yoga or meditation. Talk with your health care provider if you need help. °General Instructions °· Watch your dizziness for any changes. °· Take medicines only as directed by your health care provider. Talk with your health care provider if you think that your dizziness is caused by a medicine that you are taking. °· Tell a friend or a family member that you are feeling dizzy. If he or she notices any changes in your behavior, have this person call your health care provider. °· Keep all follow-up visits as directed by your health care provider. This is important. °SEEK MEDICAL CARE IF: °· Your dizziness does not go away. °· Your dizziness or light-headedness gets worse. °· You feel nauseous. °· You have reduced hearing. °· You have new symptoms. °· You are unsteady on your feet or you feel like the room is spinning. °SEEK IMMEDIATE MEDICAL CARE IF: °· You vomit or have diarrhea and are unable to eat or drink anything. °· You have problems talking, walking, swallowing, or using your arms, hands, or legs. °· You feel generally weak. °· You are not thinking clearly or you have trouble forming sentences. It may take a friend or family member to notice this. °· You have chest pain, abdominal pain, shortness of breath, or sweating. °· Your vision changes. °· You notice any bleeding. °· You have a headache. °· You have neck pain or a stiff neck. °· You have a fever. °  °This information is not intended to replace advice given to you by your health care provider. Make sure you discuss any questions you have with your health care provider. °  °Document Released: 08/11/2000 Document Revised: 07/02/2014  Document Reviewed: 02/11/2014 °Elsevier Interactive Patient Education ©2016 [employer] ° °

## 2015-05-17 LAB — URINALYSIS, ROUTINE W REFLEX MICROSCOPIC
Bilirubin Urine: NEGATIVE
Glucose, UA: NEGATIVE
Hgb urine dipstick: NEGATIVE
KETONES UR: NEGATIVE
LEUKOCYTES UA: NEGATIVE
NITRITE: NEGATIVE
Protein, ur: NEGATIVE
SPECIFIC GRAVITY, URINE: 1.022 (ref 1.001–1.035)
pH: 7 (ref 5.0–8.0)

## 2015-05-17 LAB — VITAMIN D 25 HYDROXY (VIT D DEFICIENCY, FRACTURES): Vit D, 25-Hydroxy: 11 ng/mL — ABNORMAL LOW (ref 30–100)

## 2015-06-04 ENCOUNTER — Ambulatory Visit: Payer: Self-pay | Admitting: Physician Assistant

## 2016-03-01 HISTORY — PX: HERNIA REPAIR: SHX51

## 2017-04-30 ENCOUNTER — Other Ambulatory Visit: Payer: Self-pay

## 2017-04-30 ENCOUNTER — Emergency Department (HOSPITAL_COMMUNITY)
Admission: EM | Admit: 2017-04-30 | Discharge: 2017-04-30 | Disposition: A | Discharge status: Home / Self Care | Payer: BLUE CROSS/BLUE SHIELD | Attending: Emergency Medicine | Admitting: Emergency Medicine

## 2017-04-30 ENCOUNTER — Emergency Department (HOSPITAL_COMMUNITY): Payer: BLUE CROSS/BLUE SHIELD

## 2017-04-30 ENCOUNTER — Encounter (HOSPITAL_COMMUNITY): Payer: Self-pay | Admitting: *Deleted

## 2017-04-30 DIAGNOSIS — I10 Essential (primary) hypertension: Secondary | ICD-10-CM | POA: Diagnosis not present

## 2017-04-30 DIAGNOSIS — Z79899 Other long term (current) drug therapy: Secondary | ICD-10-CM | POA: Diagnosis not present

## 2017-04-30 DIAGNOSIS — Z87891 Personal history of nicotine dependence: Secondary | ICD-10-CM | POA: Insufficient documentation

## 2017-04-30 DIAGNOSIS — R1084 Generalized abdominal pain: Secondary | ICD-10-CM

## 2017-04-30 DIAGNOSIS — R109 Unspecified abdominal pain: Secondary | ICD-10-CM | POA: Diagnosis present

## 2017-04-30 DIAGNOSIS — R7303 Prediabetes: Secondary | ICD-10-CM | POA: Insufficient documentation

## 2017-04-30 HISTORY — DX: Peptic ulcer, site unspecified, unspecified as acute or chronic, without hemorrhage or perforation: K27.9

## 2017-04-30 LAB — COMPREHENSIVE METABOLIC PANEL
ALBUMIN: 5.3 g/dL — AB (ref 3.5–5.0)
ALK PHOS: 62 U/L (ref 38–126)
ALT: 29 U/L (ref 17–63)
ANION GAP: 18 — AB (ref 5–15)
AST: 43 U/L — ABNORMAL HIGH (ref 15–41)
BUN: 32 mg/dL — ABNORMAL HIGH (ref 6–20)
CALCIUM: 10.1 mg/dL (ref 8.9–10.3)
CHLORIDE: 99 mmol/L — AB (ref 101–111)
CO2: 18 mmol/L — AB (ref 22–32)
Creatinine, Ser: 1.61 mg/dL — ABNORMAL HIGH (ref 0.61–1.24)
GFR calc non Af Amer: 51 mL/min — ABNORMAL LOW (ref >60)
GFR, EST AFRICAN AMERICAN: 59 mL/min — AB (ref >60)
GLUCOSE: 118 mg/dL — AB (ref 65–99)
POTASSIUM: 4.6 mmol/L (ref 3.5–5.1)
SODIUM: 135 mmol/L (ref 135–145)
Total Bilirubin: 1.5 mg/dL — ABNORMAL HIGH (ref 0.3–1.2)
Total Protein: 9.2 g/dL — ABNORMAL HIGH (ref 6.5–8.1)

## 2017-04-30 LAB — CBC WITH DIFFERENTIAL/PLATELET
BASOS PCT: 0 %
Basophils Absolute: 0 10*3/uL (ref 0.0–0.1)
EOS ABS: 0 10*3/uL (ref 0.0–0.7)
EOS PCT: 0 %
HCT: 51.3 % (ref 39.0–52.0)
HEMOGLOBIN: 17.8 g/dL — AB (ref 13.0–17.0)
Lymphocytes Relative: 9 %
Lymphs Abs: 1.4 10*3/uL (ref 0.7–4.0)
MCH: 28 pg (ref 26.0–34.0)
MCHC: 34.7 g/dL (ref 30.0–36.0)
MCV: 80.8 fL (ref 78.0–100.0)
Monocytes Absolute: 0.8 10*3/uL (ref 0.1–1.0)
Monocytes Relative: 5 %
NEUTROS PCT: 86 %
Neutro Abs: 14 10*3/uL — ABNORMAL HIGH (ref 1.7–7.7)
PLATELETS: 299 10*3/uL (ref 150–400)
RBC: 6.35 MIL/uL — ABNORMAL HIGH (ref 4.22–5.81)
RDW: 13.3 % (ref 11.5–15.5)
WBC: 16.2 10*3/uL — AB (ref 4.0–10.5)

## 2017-04-30 LAB — URINALYSIS, ROUTINE W REFLEX MICROSCOPIC
BILIRUBIN URINE: NEGATIVE
Bacteria, UA: NONE SEEN
GLUCOSE, UA: NEGATIVE mg/dL
KETONES UR: 80 mg/dL — AB
LEUKOCYTES UA: NEGATIVE
NITRITE: NEGATIVE
PROTEIN: NEGATIVE mg/dL
Specific Gravity, Urine: 1.044 — ABNORMAL HIGH (ref 1.005–1.030)
Squamous Epithelial / LPF: NONE SEEN
pH: 6 (ref 5.0–8.0)

## 2017-04-30 LAB — LIPASE, BLOOD: Lipase: 26 U/L (ref 11–51)

## 2017-04-30 MED ORDER — IOPAMIDOL (ISOVUE-300) INJECTION 61%
INTRAVENOUS | Status: AC
  Administered 2017-04-30: 100 mL
  Filled 2017-04-30: qty 100

## 2017-04-30 MED ORDER — MORPHINE SULFATE (PF) 4 MG/ML IV SOLN
8.0000 mg | Freq: Once | INTRAVENOUS | Status: AC
  Administered 2017-04-30: 8 mg via INTRAVENOUS
  Filled 2017-04-30: qty 2

## 2017-04-30 MED ORDER — SODIUM CHLORIDE 0.9 % IV BOLUS (SEPSIS)
1000.0000 mL | Freq: Once | INTRAVENOUS | Status: AC
  Administered 2017-04-30: 1000 mL via INTRAVENOUS

## 2017-04-30 MED ORDER — ONDANSETRON HCL 4 MG/2ML IJ SOLN
4.0000 mg | Freq: Once | INTRAMUSCULAR | Status: AC
Start: 2017-04-30 — End: 2017-04-30
  Administered 2017-04-30: 4 mg via INTRAVENOUS
  Filled 2017-04-30: qty 2

## 2017-04-30 MED ORDER — PROMETHAZINE HCL 25 MG RE SUPP: 25.0000 mg | Freq: Four times a day (QID) | RECTAL | 0 refills | Status: AC | PRN

## 2017-04-30 MED ORDER — PANTOPRAZOLE SODIUM 40 MG IV SOLR
40.0000 mg | Freq: Once | INTRAVENOUS | Status: AC
Start: 2017-04-30 — End: 2017-04-30
  Administered 2017-04-30: 40 mg via INTRAVENOUS
  Filled 2017-04-30: qty 40

## 2017-04-30 NOTE — ED Triage Notes (Signed)
Pt reports vomiting and vomiting started last night . Pain now 10/10 . Pt reports ABD hernia repair in [place]. Pt reports vomiting and filling 2 emsis bags.

## 2017-04-30 NOTE — ED Notes (Signed)
Got patient on the monitor patient is resting with call bell in reach and family at bedside °

## 2017-04-30 NOTE — ED Notes (Signed)
Pt reports he is having no nausea drinking water but it is causing stomach to cramp.  Pt stopped drinking water for a while.

## 2017-04-30 NOTE — ED Provider Notes (Signed)
[facility]PART[facility] Provider Note   CSN: [id number] Arrival date & time: 04/30/17  0732     History   Chief Complaint Chief Complaint  Patient presents with  . Abdominal Pain    HPI Warren Thomas is a 42 y.o. male.  Patient is a 42 year old male who presents with abdominal pain.  He states he started feeling cold all over about 930 yesterday morning and then several hours later started having nausea and vomiting.  He reports bilious and coffee-ground emesis.  No gross blood.  He has generalized abdominal pain associated with the symptoms.  He is having normal bowel movements.  No known fevers.  No urinary symptoms.  He did have a recent umbilical hernia repair done in [place] this past December.  He also has had these symptoms of abdominal pain associated with nausea vomiting recurrently.  He feels like it happens more when he gets stressed out.  He was previously seen a gastroenterologist who did an endoscopy and colonoscopy and did not find any etiology for his symptoms.  He uses Phenergan at home but says that this does not seem to be helping anymore.  He is no longer seeing a gastroenterologist.      Past Medical History:  Diagnosis Date  . Anxiety   . Barrett's esophagus   . Diverticulitis   . Esophagitis   . GERD (gastroesophageal reflux disease)   . Hiatal hernia   . Hyperlipidemia   . Hypertension    labile  . Peptic ulcer 2016  . Prediabetes   . Stomach ulcer   . Umbilical hernia     Patient Active Problem List   Diagnosis Date Noted  . Vitamin D deficiency 05/16/2015  . Medication management 05/16/2015  . Hyperlipidemia   . Hypertension   . Prediabetes   . Diverticulitis   . Anxiety   . GERD (gastroesophageal reflux disease)   . Barrett's esophagus   . Abdominal tenderness, generalized 06/11/2011  . PUD (peptic ulcer disease) 06/11/2011    Past Surgical History:  Procedure Laterality Date  . HERNIA REPAIR N/A  2018   ABD hedrnia  . UPPER GASTROINTESTINAL ENDOSCOPY         Home Medications    Prior to Admission medications   Medication Sig Start Date End Date Taking? Authorizing Provider  ALPRAZolam Duanne Moron) 1 MG tablet Take 1 mg by mouth at bedtime as needed. Panic disorder 04/14/17  Yes [provider]  clindamycin (CLEOCIN) 300 MG capsule Take 300 mg by mouth 3 (three) times daily. Until gone started dose on 04-26-17 04/21/17  Yes [provider]  esomeprazole (NEXIUM) 40 MG capsule Take 40 mg by mouth daily at 12 noon.   Yes [provider]  Melatonin 5 MG CHEW Chew 2 tablets by mouth at bedtime.   Yes [provider]  promethazine (PHENERGAN) 25 MG tablet Take 25 mg by mouth every 8 (eight) hours as needed for nausea/vomiting. 05/20/16  Yes [provider]  traMADol (ULTRAM) 50 MG tablet Take 50 mg by mouth every 6 (six) hours as needed for pain. 04/15/17  Yes [provider]  atenolol (TENORMIN) 50 MG tablet Take 1 tablet (50 mg total) by mouth daily. 05/16/15 05/15/16  Vicie Mutters, PA-C  promethazine (PHENERGAN) 25 MG suppository Place 1 suppository (25 mg total) rectally every 6 (six) hours as needed for nausea or vomiting. 04/30/17   Malvin Johns, MD    Family History Family History  Adopted: Yes  Family history unknown: Yes    Social History Social History   Tobacco Use  . Smoking status: Former Smoker    Last attempt to quit: 06/17/2010    Years since quitting: 6.8  . Smokeless tobacco: Never Used  Substance Use Topics  . Alcohol use: Yes    Comment: socialy  . Drug use: No    Comment: helps with nausea and anxiety /recreational     Allergies   Penicillins   Review of Systems Review of Systems  Constitutional: Negative for chills, diaphoresis, fatigue and fever.  HENT: Negative for congestion, rhinorrhea and sneezing.   Eyes: Negative.   Respiratory: Negative for cough, chest tightness and shortness of breath.    Cardiovascular: Negative for chest pain and leg swelling.  Gastrointestinal: Positive for abdominal pain, nausea and vomiting. Negative for blood in stool and diarrhea.  Genitourinary: Negative for difficulty urinating, flank pain, frequency and hematuria.  Musculoskeletal: Negative for arthralgias and back pain.  Skin: Negative for rash.  Neurological: Negative for dizziness, speech difficulty, weakness, numbness and headaches.     Physical Exam Updated Vital Signs BP (!) 127/92   Pulse 84   Temp 98.2 F (36.8 C) (Oral)   Resp 18   Ht 5' 8.5" (1.74 m)   Wt 102.1 kg (225 lb)   SpO2 98%   BMI 33.71 kg/m   Physical Exam  Constitutional: He is oriented to person, place, and time. He appears well-developed and well-nourished. He appears distressed.  HENT:  Head: Normocephalic and atraumatic.  Eyes: Pupils are equal, round, and reactive to light.  Neck: Normal range of motion. Neck supple.  Cardiovascular: Normal rate, regular rhythm and normal heart sounds.  Pulmonary/Chest: Effort normal and breath sounds normal. No respiratory distress. He has no wheezes. He has no rales. He exhibits no tenderness.  Abdominal: Soft. Bowel sounds are normal. There is no tenderness. There is no rebound and no guarding.  Generalized abdominal tenderness  Musculoskeletal: Normal range of motion. He exhibits no edema.  Lymphadenopathy:    He has no cervical adenopathy.  Neurological: He is alert and oriented to person, place, and time.  Skin: Skin is warm and dry. No rash noted.  Psychiatric: He has a normal mood and affect.     ED Treatments / Results  Labs (all labs ordered are listed, but only abnormal results are displayed) Labs Reviewed  CBC WITH DIFFERENTIAL/PLATELET - Abnormal; Notable for the following components:      Result Value   WBC 16.2 (*)    RBC 6.35 (*)    Hemoglobin 17.8 (*)    Neutro Abs 14.0 (*)    All other components within normal limits  COMPREHENSIVE METABOLIC  PANEL - Abnormal; Notable for the following components:   Chloride 99 (*)    CO2 18 (*)    Glucose, Bld 118 (*)    BUN 32 (*)    Creatinine, Ser 1.61 (*)    Total Protein 9.2 (*)    Albumin 5.3 (*)    AST 43 (*)    Total Bilirubin 1.5 (*)    GFR calc non Af Amer 51 (*)    GFR calc Af Amer 59 (*)    Anion gap 18 (*)    All other components within normal limits  URINALYSIS, ROUTINE W REFLEX MICROSCOPIC - Abnormal; Notable for the following components:   Specific Gravity, Urine 1.044 (*)    Hgb urine dipstick SMALL (*)    Ketones, ur 80 (*)  All other components within normal limits  LIPASE, BLOOD    EKG  EKG Interpretation None       Radiology Ct Abdomen Pelvis W Contrast  Result Date: 04/30/2017 CLINICAL DATA:  42 year old male with history of abdominal pain and nausea and vomiting since yesterday evening. EXAM: CT ABDOMEN AND PELVIS WITH CONTRAST TECHNIQUE: Multidetector CT imaging of the abdomen and pelvis was performed using the standard protocol following bolus administration of intravenous contrast. CONTRAST:  175mL ISOVUE-300 IOPAMIDOL (ISOVUE-300) INJECTION 61% COMPARISON:  CT the abdomen and pelvis 06/25/2012. FINDINGS: Lower chest: Unremarkable. Hepatobiliary: No suspicious cystic or solid hepatic lesions. No intra or extrahepatic biliary ductal dilatation. Gallbladder is normal in appearance. Pancreas: No pancreatic mass. No pancreatic ductal dilatation. No pancreatic or peripancreatic fluid or inflammatory changes. Spleen: Unremarkable. Adrenals/Urinary Tract: Bilateral kidneys and bilateral adrenal glands are normal in appearance. No hydroureteronephrosis. Urinary bladder is normal in appearance. Stomach/Bowel: Normal appearance of the stomach. No pathologic dilatation of small bowel or colon. Normal appendix. Vascular/Lymphatic: No significant atherosclerotic disease, aneurysm or dissection noted in the abdominal or pelvic vasculature. No lymphadenopathy noted in the  abdomen or pelvis. Reproductive: Prostate gland and seminal vesicles are unremarkable in appearance. Other: Tiny supraumbilical ventral hernia containing only omental fat. No significant volume of ascites. No pneumoperitoneum. Musculoskeletal: There are no aggressive appearing lytic or blastic lesions noted in the visualized portions of the skeleton. IMPRESSION: 1. No acute findings are noted in the abdomen or pelvis to account for the patient's symptoms. 2. Small supraumbilical ventral hernia containing only fat. No associated bowel incarceration or obstruction at this time. 3. Normal appendix. Electronically Signed   By: Vinnie Langton M.D.   On: 04/30/2017 11:26    Procedures Procedures (including critical care time)  Medications Ordered in ED Medications  morphine 4 MG/ML injection 8 mg (8 mg Intravenous Given 04/30/17 0852)  ondansetron (ZOFRAN) injection 4 mg (4 mg Intravenous Given 04/30/17 0852)  sodium chloride 0.9 % bolus 1,000 mL (0 mLs Intravenous Stopped 04/30/17 1016)  pantoprazole (PROTONIX) injection 40 mg (40 mg Intravenous Given 04/30/17 0853)  iopamidol (ISOVUE-300) 61 % injection (100 mLs  Contrast Given 04/30/17 1052)     Initial Impression / Assessment and Plan / ED Course  I have reviewed the triage vital signs and the nursing notes.  Pertinent labs & imaging results that were available during my care of the patient were reviewed by me and considered in my medical decision making (see chart for details).     Patient is a 42 year old who has a history of recurrent abdominal pain associated nausea vomiting.  He presents with an episode today of similar symptoms.  His CT scan does not show any acute abnormalities.  His labs are non-concerning.  He has a little bit of elevation in his creatinine which is likely from some dehydration.  He also has a mild bump in his bilirubin but no significant elevation in his other LFTs.  He has improved with symptomatic treatment in the emergency  department.  He has no ongoing vomiting.  He is able to tolerate oral fluids.  He was discharged home in good condition.  He was encouraged to follow-up with his PCP.  He may need a recheck on his labs which I advised him of.  Return precautions were given.  He was given a prescription for Phenergan suppositories.  Final Clinical Impressions(s) / ED Diagnoses   Final diagnoses:  Generalized abdominal pain    ED Discharge Orders  Ordered    promethazine (PHENERGAN) 25 MG suppository  Every 6 hours PRN     04/30/17 1324       Malvin Johns, MD 04/30/17 1326

## 2017-06-27 ENCOUNTER — Telehealth: Payer: Self-pay | Admitting: Internal Medicine

## 2017-06-27 NOTE — Telephone Encounter (Signed)
TRANSFERRED GI CARE TO [facility]/DR Tora Duck. LAST SEEN 06-17-16

## 2018-08-30 DEATH — deceased

## 2018-12-06 IMAGING — CT CT ABD-PELV W/ CM
2 of 5 series · 16 of 46 positions shown, 18 images · IV contrast (APPLIED)
Comparison: CT the abdomen and pelvis 06/25/2012.

CLINICAL DATA: 41-year-old male with history of abdominal pain and
nausea and vomiting since yesterday evening.

EXAM:
CT ABDOMEN AND PELVIS WITH CONTRAST
TECHNIQUE: Multidetector CT imaging of the abdomen and pelvis was performed
using the standard protocol following bolus administration of
intravenous contrast.
CONTRAST:  100mL 5L8NI6-3UU IOPAMIDOL (5L8NI6-3UU) INJECTION 61%

[Series 3: abd/ pelvis 5.0 i30f 2 · axial · 0.94mm/px · z∈[+833,+1283]mm · 13 of 101 slices shown, 15 images]
[im 6/101  soft-tissue]
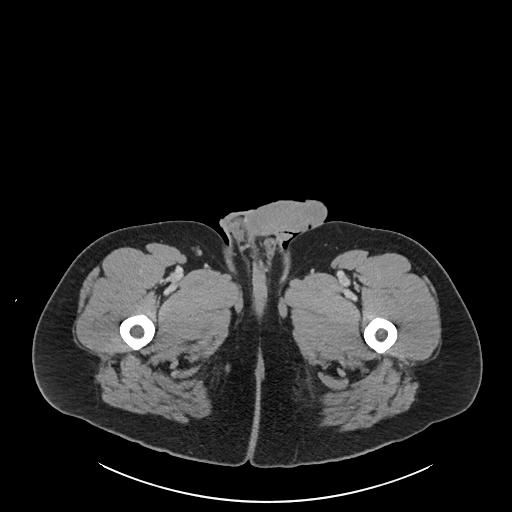
[im 6/101  bone]
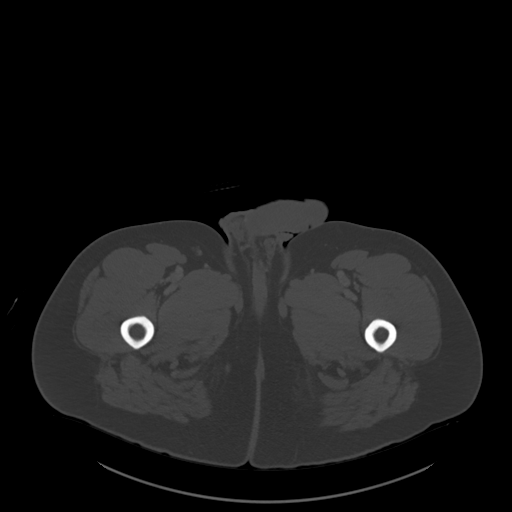
[im 16/101  soft-tissue]
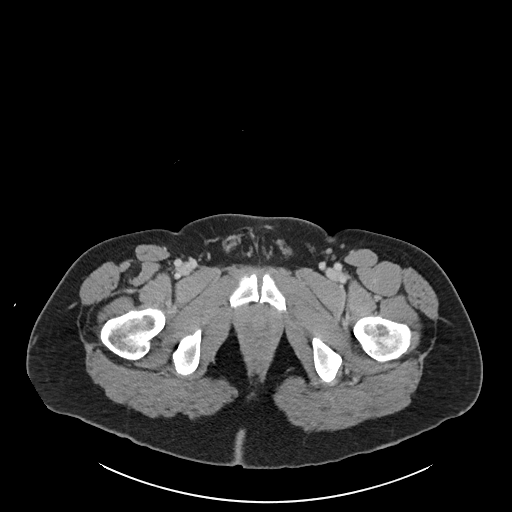
[im 21/101  soft-tissue]
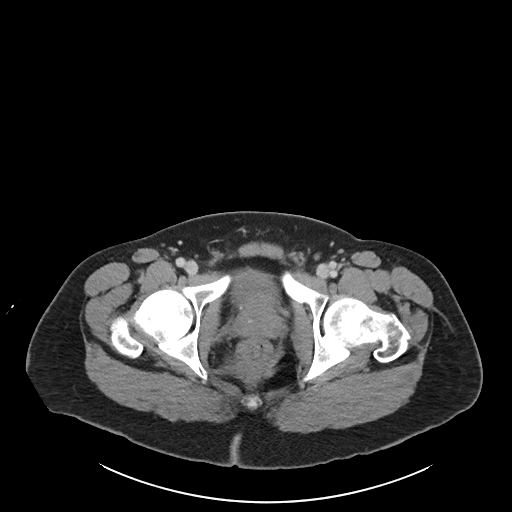
[im 31/101  soft-tissue]
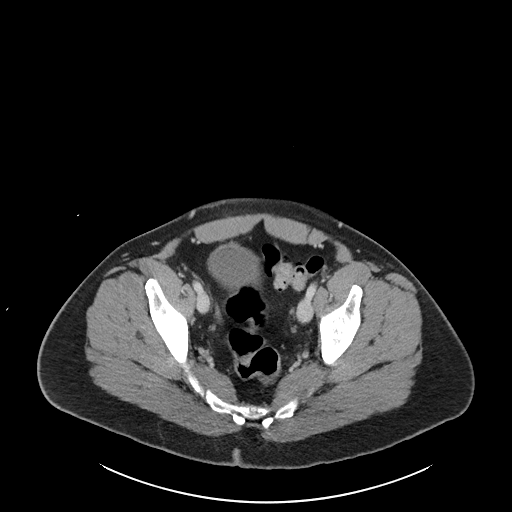
[im 36/101  soft-tissue]
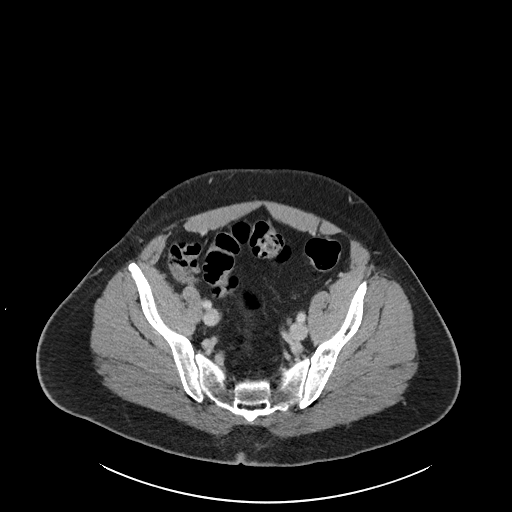
[im 46/101  soft-tissue]
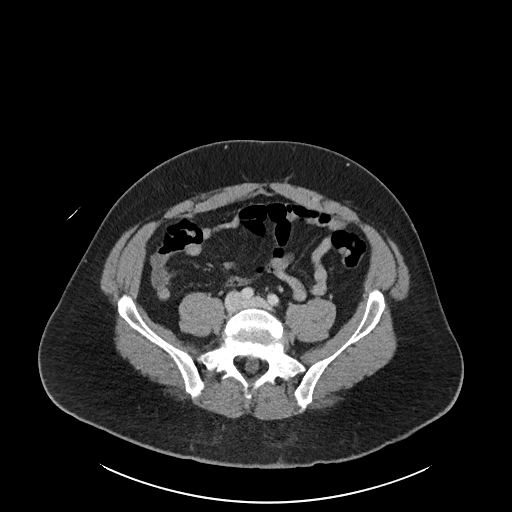
[im 51/101  soft-tissue]
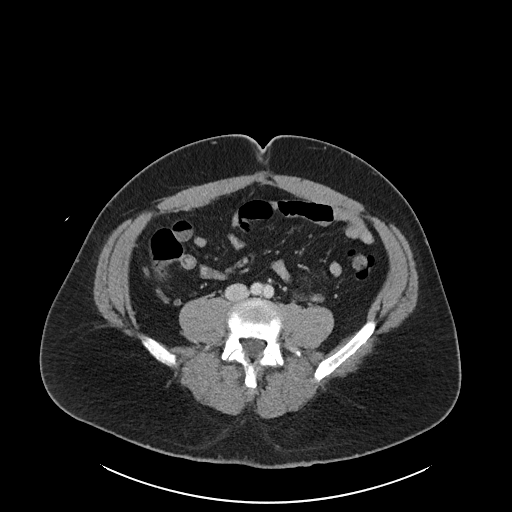
[im 56/101  soft-tissue]
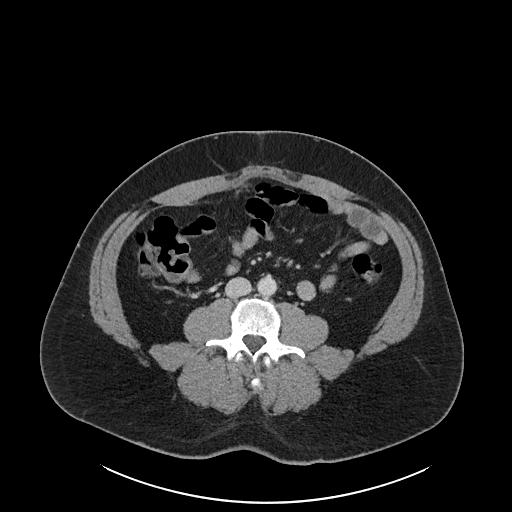
[im 66/101  soft-tissue]
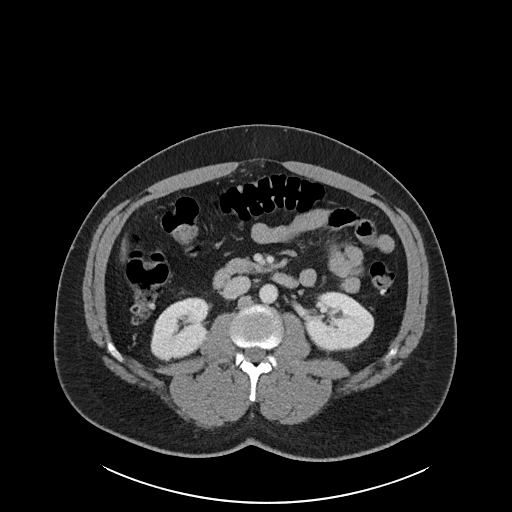
[im 66/101  bone]
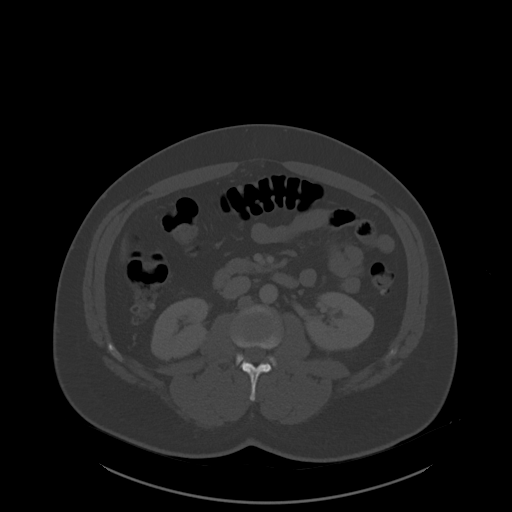
[im 71/101  soft-tissue]
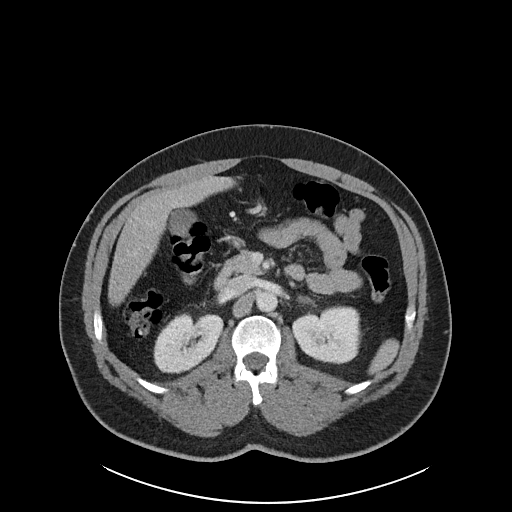
[im 81/101  soft-tissue]
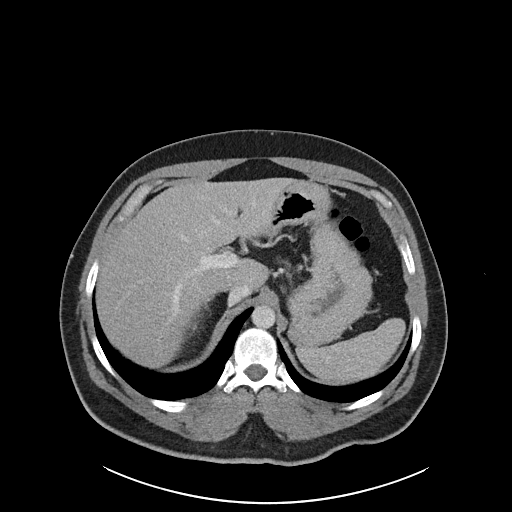
[im 86/101  soft-tissue]
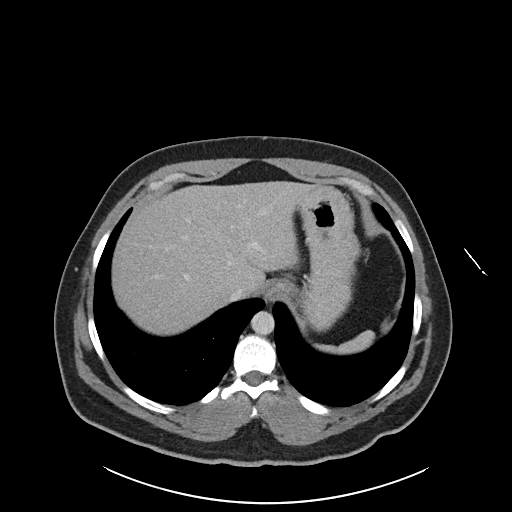
[im 96/101  soft-tissue]
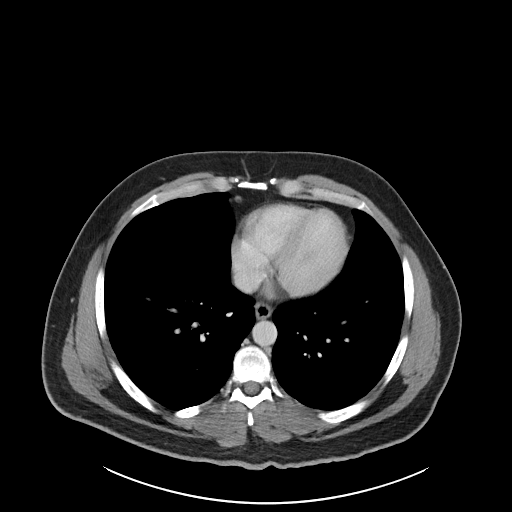

[Series 6: coronal soft tissue · coronal · 0.98mm/px · 3 of 126 slices shown]
[im 42/126  soft-tissue]
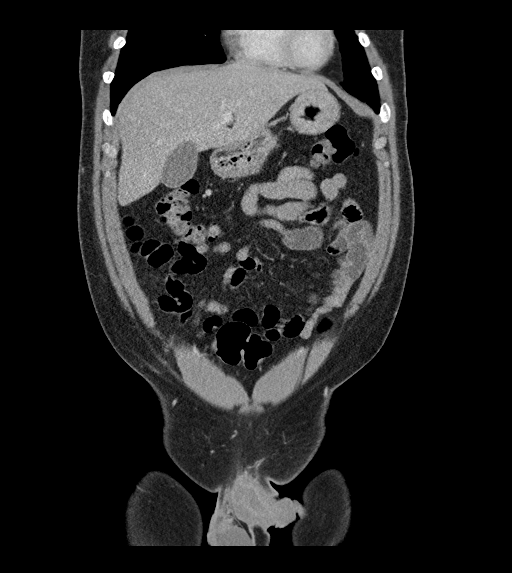
[im 56/126  soft-tissue]
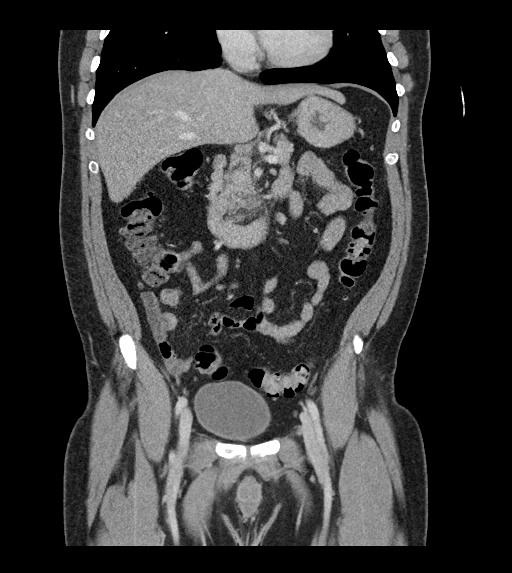
[im 70/126  soft-tissue]
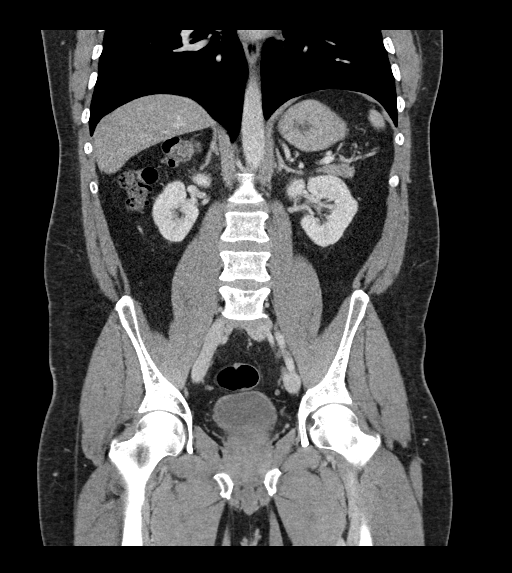

[16 of 46 positions shown; findings below may reference images not displayed]

FINDINGS: Lower chest: Unremarkable.

Hepatobiliary: No suspicious cystic or solid hepatic lesions. No
intra or extrahepatic biliary ductal dilatation. Gallbladder is
normal in appearance.

Pancreas: No pancreatic mass. No pancreatic ductal dilatation. No
pancreatic or peripancreatic fluid or inflammatory changes.

Spleen: Unremarkable.

Adrenals/Urinary Tract: Bilateral kidneys and bilateral adrenal
glands are normal in appearance. No hydroureteronephrosis. Urinary
bladder is normal in appearance.

Stomach/Bowel: Normal appearance of the stomach. No pathologic
dilatation of small bowel or colon. Normal appendix.

Vascular/Lymphatic: No significant atherosclerotic disease, aneurysm
or dissection noted in the abdominal or pelvic vasculature. No
lymphadenopathy noted in the abdomen or pelvis.

Reproductive: Prostate gland and seminal vesicles are unremarkable
in appearance.

Other: Tiny supraumbilical ventral hernia containing only omental
fat. No significant volume of ascites. No pneumoperitoneum.

Musculoskeletal: There are no aggressive appearing lytic or blastic
lesions noted in the visualized portions of the skeleton.
IMPRESSION: 1. No acute findings are noted in the abdomen or pelvis to account
for the patient's symptoms.
2. Small supraumbilical ventral hernia containing only fat. No
associated bowel incarceration or obstruction at this time.
3. Normal appendix.
